# Patient Record
Sex: Female | Born: 1995 | Race: White | Hispanic: Yes | Marital: Married | Smoking: Never smoker
Health system: Southern US, Community
[De-identification: ages and names within clinical notes are randomized; demographics above are authoritative.]

## PROBLEM LIST (undated history)

## (undated) DIAGNOSIS — K130 Diseases of lips: Secondary | ICD-10-CM

## (undated) DIAGNOSIS — N979 Female infertility, unspecified: Secondary | ICD-10-CM

## (undated) DIAGNOSIS — D649 Anemia, unspecified: Secondary | ICD-10-CM

## (undated) DIAGNOSIS — IMO0002 Reserved for concepts with insufficient information to code with codable children: Secondary | ICD-10-CM

## (undated) DIAGNOSIS — M329 Systemic lupus erythematosus, unspecified: Secondary | ICD-10-CM

## (undated) DIAGNOSIS — F419 Anxiety disorder, unspecified: Secondary | ICD-10-CM

---
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
## 2016-03-14 ENCOUNTER — Encounter: Payer: Self-pay | Admitting: Pediatrics

## 2016-03-15 ENCOUNTER — Encounter: Payer: Self-pay | Admitting: Pediatrics

## 2017-01-31 ENCOUNTER — Ambulatory Visit (INDEPENDENT_AMBULATORY_CARE_PROVIDER_SITE_OTHER): Payer: Managed Care, Other (non HMO) | Admitting: Family Medicine

## 2017-01-31 VITALS — BP 117/79 | HR 86 | Temp 99.0°F | Resp 16 | Ht 63.0 in | Wt 139.0 lb

## 2017-01-31 DIAGNOSIS — R300 Dysuria: Secondary | ICD-10-CM | POA: Diagnosis not present

## 2017-01-31 LAB — POCT UA - MICROSCOPIC ONLY
MUCUS UA: ABSENT
WBC, Ur, HPF, POC: NEGATIVE

## 2017-01-31 LAB — POCT URINALYSIS DIPSTICK
BILIRUBIN UA: NEGATIVE
GLUCOSE UA: NEGATIVE
Ketones, UA: NEGATIVE
NITRITE UA: NEGATIVE
Spec Grav, UA: >=1.03 — AB (ref 1.010–1.025)
Urobilinogen, UA: 0.2 E.U./dL
pH, UA: 6 (ref 5.0–8.0)

## 2017-01-31 MED ORDER — CIPROFLOXACIN HCL 500 MG PO TABS: 500.0000 mg | ORAL_TABLET | Freq: Two times a day (BID) | ORAL | 0 refills | Status: AC

## 2017-01-31 NOTE — Progress Notes (Signed)
  Chief Complaint  Patient presents with  . Dysuria    Began monday    HPI   Pt reports that for the past 4 days  She took otc meds for UT that has not helped  She had burning with urination  She denies history of pyelo No blood in urine  No fevers  Reports chills +urinary frequency Pain at the end of the stream No vaginal discharge  Past Medical History:  Diagnosis Date  . 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 ciprofloxacin (CIPRO) 500 MG tablet Take 1 tablet (500 mg total) by mouth 2 (two) times daily. 6 tablet 0  . ipratropium (ATROVENT) 0.03 % nasal spray Use 2 sprays each nostril 3 or 4 times daily for cold (Patient not taking: Reported on 01/31/2017) 30 mL 0   No current facility-administered medications for this visit.     Allergies: No Known Allergies  Past Surgical History:  Procedure Laterality Date  . LABIOPLASTY Right 01/28/2013   Procedure: LABIAPLASTY;  Surgeon: Sherron MondayJody Bovard, MD;  Location: [facility];  Service: Gynecology;  Laterality: Right;  Right labia    Social History   Social History  . Marital status: Single    Spouse name: N/A  . Number of children: N/A  . Years of education: N/A   Social History Main Topics  . Smoking status: Never Smoker  . Smokeless tobacco: Not on file  . Alcohol use Not on file  . Drug use: Unknown  . Sexual activity: Not on file   Other Topics Concern  . Not on file   Social History Narrative  . No narrative on file    ROS  Objective: Vitals:   01/31/17 1447  BP: 117/79  Pulse: 86  Resp: 16  Temp: 99 F (37.2 C)  TempSrc: Oral  SpO2: 99%  Weight: 139 lb (63 kg)  Height: 5\' 3"  (1.6 m)    Physical Exam  Constitutional: She is oriented to person, place, and time. She appears well-developed and well-nourished.  HENT:  Head: Normocephalic and atraumatic.  Eyes: Conjunctivae and EOM are normal.  Cardiovascular: Normal rate, regular  rhythm and normal heart sounds.   No murmur heard. Pulmonary/Chest: Effort normal.  Abdominal: Soft. Bowel sounds are normal. She exhibits no distension. There is no tenderness.  Neurological: She is alert and oriented to person, place, and time.  Psychiatric: She has a normal mood and affect. Her behavior is normal. Judgment and thought content normal.    Assessment and Plan Alexis Taylor was seen today for dysuria.  Diagnoses and all orders for this visit:  Dysuria -     POCT urinalysis dipstick -     POCT UA - Microscopic Only -     Urine Culture  Will treat empirically with Cipro Discussed that since she took some OTC meds will send culture -     ciprofloxacin (CIPRO) 500 MG tablet; Take 1 tablet (500 mg total) by mouth 2 (two) times daily.     [name] A Geni Skorupski

## 2017-01-31 NOTE — Patient Instructions (Addendum)
Acute Urinary Retention, Female Urinary retention means you are unable to pee completely or at all (empty your bladder). Follow these instructions at home:  Drink enough fluids to keep your pee (urine) clear or pale yellow.  If you are sent home with a tube that drains the bladder (catheter), there will be a drainage bag attached to it. There are two types of bags. One is big that you can wear at night without having to empty it. One is smaller and needs to be emptied more often.  Keep the drainage bag emptied.  Keep the drainage bag lower than the tube.  Only take medicine as told by your doctor. Contact a doctor if:  You have a low-grade fever.  You have spasms or you are leaking pee when you have spasms. Get help right away if:  You have chills or a fever.  Your catheter stops draining pee.  Your catheter falls out.  You have increased bleeding that does not stop after you have rested and increased the amount of fluids you had been drinking. This information is not intended to replace advice given to you by your health care provider. Make sure you discuss any questions you have with your health care provider. Document Released: 01/23/2008 Document Revised: 01/12/2016 Document Reviewed: 01/15/2013 Elsevier Interactive Patient Education  2017 [employer]  

## 2017-02-05 LAB — URINE CULTURE

## 2017-10-05 ENCOUNTER — Encounter: Payer: Self-pay | Admitting: Family Medicine

## 2017-10-05 NOTE — Progress Notes (Unsigned)
No chief complaint on file.   Subjective:  Alexis Taylor is a 22 y.o. female here for a health maintenance visit.  Patient is {new /established:20252} pt  Patient Active Problem List   Diagnosis Date Noted  . 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 Pain in joint, ankle and foot 03/25/2013  . 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 LABIOPLASTY Right 01/28/2013   Procedure: LABIAPLASTY;  Surgeon: Sherron Monday, MD;  Location: [facility];  Service: Gynecology;  Laterality: Right;  Right labia     Outpatient Medications Prior to Visit  Medication Sig Dispense Refill  . ipratropium (ATROVENT) 0.03 % nasal spray Use 2 sprays each nostril 3 or 4 times daily for cold (Patient not taking: Reported on 01/31/2017) 30 mL 0   No facility-administered medications prior to visit.     No Known Allergies   No family history on file.   Health Habits: Dental Exam: up to date Eye Exam: up to date Exercise: *** times/week on average Current exercise activities: walking/running Diet:   Social History   Socioeconomic History  . Marital status: Single    Spouse name: Not on file  . Number of children: Not on file  . Years of education: Not on file  . Highest education level: Not on file  Social Needs  . Financial resource strain: Not on file  . Food insecurity - worry: Not on file  . Food insecurity - inability: Not on file  . Transportation needs - medical: Not on file  . Transportation needs - non-medical: Not on file  Occupational History  . Not on file  Tobacco Use  . Smoking status: Never Smoker  Substance and Sexual Activity  . Alcohol use: Not on file  . Drug use: Not on file  . Sexual activity: Not on file  Other Topics Concern  . Not on file  Social History Narrative  . Not on file   Social History   Substance and Sexual Activity   Alcohol Use Not on file   Social History   Tobacco Use  Smoking Status Never Smoker   Social History   Substance and Sexual Activity  Drug Use Not on file    GYN: Sexual Health Menstrual status: regular menses LMP: No LMP recorded. Last pap smear: see HM section History of abnormal pap smears:  Sexually active: ** with ** partner Current contraception:   Health Maintenance: See under health Maintenance activity for review of completion dates as well.  There is no immunization history on file for this patient.    Depression Screen-PHQ2/9 Depression screen [facility] 2/9 01/31/2017 05/07/2015  Decreased Interest 0 0  Down, Depressed, Hopeless 0 0  PHQ - 2 Score 0 0       Depression Severity and Treatment Recommendations:  0-4= None  5-9= Mild / Treatment: Support, educate to call if worse; return in one month  10-14= Moderate / Treatment: Support, watchful waiting; Antidepressant or Psycotherapy  15-19= Moderately severe / Treatment: Antidepressant OR Psychotherapy  >= 20 = Major depression, severe / Antidepressant AND Psychotherapy    Review of Systems   ROS  See HPI for ROS as well.    Objective:   There were no vitals filed for this visit.  There is no height or weight on file to calculate BMI.  Physical Exam     Assessment/Plan:  Patient was seen for a health maintenance exam.  Counseled the patient on health maintenance issues. Reviewed her health mainteance schedule and ordered appropriate tests (see orders.) Counseled on regular exercise and weight management. Recommend regular eye exams and dental cleaning.   The following issues were addressed today for health maintenance:   There are no diagnoses linked to this encounter.  No Follow-up on file.    There is no height or weight on file to calculate BMI.:  Discussed the patient's BMI with patient. The BMI body mass index is unknown because there is no height or weight on file.     Future  Appointments  Date Time Provider Department Center  10/05/2017 10:40 AM Doristine BosworthStallings, Darlisha Kelm A, MD PCP-PCP [facility]    There are no Patient Instructions on file for this visit.

## 2017-11-18 ENCOUNTER — Ambulatory Visit (INDEPENDENT_AMBULATORY_CARE_PROVIDER_SITE_OTHER): Payer: Self-pay | Admitting: Orthopaedic Surgery

## 2018-11-15 ENCOUNTER — Encounter (HOSPITAL_COMMUNITY): Payer: Self-pay | Admitting: Emergency Medicine

## 2018-11-15 ENCOUNTER — Emergency Department (HOSPITAL_COMMUNITY)
Admission: EM | Admit: 2018-11-15 | Discharge: 2018-11-15 | Disposition: A | Discharge status: Home / Self Care | Payer: Self-pay | Attending: Emergency Medicine | Admitting: Emergency Medicine

## 2018-11-15 ENCOUNTER — Other Ambulatory Visit: Payer: Self-pay

## 2018-11-15 DIAGNOSIS — N3090 Cystitis, unspecified without hematuria: Secondary | ICD-10-CM | POA: Insufficient documentation

## 2018-11-15 LAB — URINALYSIS, ROUTINE W REFLEX MICROSCOPIC
BILIRUBIN URINE: NEGATIVE
Glucose, UA: NEGATIVE mg/dL
HGB URINE DIPSTICK: NEGATIVE
KETONES UR: NEGATIVE mg/dL
LEUKOCYTE UA: NEGATIVE
Nitrite: POSITIVE — AB
Protein, ur: NEGATIVE mg/dL
SPECIFIC GRAVITY, URINE: 1.016 (ref 1.005–1.030)
pH: 6 (ref 5.0–8.0)

## 2018-11-15 LAB — POC URINE PREG, ED: PREG TEST UR: NEGATIVE

## 2018-11-15 MED ORDER — NITROFURANTOIN MONOHYD MACRO 100 MG PO CAPS: 100.0000 mg | ORAL_CAPSULE | Freq: Two times a day (BID) | ORAL | 0 refills | Status: DC

## 2018-11-15 MED ORDER — PHENAZOPYRIDINE HCL 200 MG PO TABS: 200.0000 mg | ORAL_TABLET | Freq: Three times a day (TID) | ORAL | 0 refills | Status: DC

## 2018-11-15 MED ORDER — NITROFURANTOIN MONOHYD MACRO 100 MG PO CAPS
100.0000 mg | ORAL_CAPSULE | Freq: Once | ORAL | Status: AC
  Administered 2018-11-15: 100 mg via ORAL
  Filled 2018-11-15: qty 1

## 2018-11-15 MED ORDER — PHENAZOPYRIDINE HCL 100 MG PO TABS
200.0000 mg | ORAL_TABLET | Freq: Once | ORAL | Status: AC
  Administered 2018-11-15: 200 mg via ORAL
  Filled 2018-11-15: qty 2

## 2018-11-15 NOTE — Discharge Instructions (Signed)
Please read and follow all provided instructions.  Your diagnoses today include:  1. Cystitis     Tests performed today include:  Urine test - suggests that you have an infection in your bladder  Vital signs. See below for your results today.   Medications prescribed:   Macrobid - antibiotic for urinary tract infection  You have been prescribed an antibiotic medicine: take the entire course of medicine even if you are feeling better. Stopping early can cause the antibiotic not to work.   Pyridium - medication for urinary tract infection symptoms.   This medication will turn your urine orange. This is normal.   Home care instructions:  Follow any educational materials contained in this packet.  Follow-up instructions: Please follow-up with your primary care provider in 3 days if symptoms are not resolved for further evaluation of your symptoms.  Return instructions:   Please return to the Emergency Department if you experience worsening symptoms.   Return with fever, worsening pain, persistent vomiting, worsening pain in your back.   Please return if you have any other emergent concerns.  Additional Information:  Your vital signs today were: Temp 98.2 F (36.8 C) (Oral)    Wt 63 kg    BMI 24.60 kg/m  If your blood pressure (BP) was elevated above 135/85 this visit, please have this repeated by your doctor within one month. --------------

## 2018-11-15 NOTE — ED Triage Notes (Signed)
Pt in from home with c/o urinary frequency, burning with urination, and bladder fullness x 5 days. Denies fevers or flank pain. States urine is malorodous

## 2018-11-15 NOTE — ED Provider Notes (Signed)
[facility] EMERGENCY DEPARTMENT Provider Note   CSN: [id number] Arrival date & time: 11/15/18  2094    History   Chief Complaint Chief Complaint  Patient presents with  . Urinary Symptoms    HPI Alexis Taylor is a 23 y.o. female.     Patient presents the emergency department with 5-day history of pubic pain, dysuria, increased frequency and urgency, and a small amount of hematuria.  No previous history of UTI.  Patient denies back pain, fevers, vomiting.  She has been nauseous.  She is on birth control and does not have regular periods.  No vaginal bleeding.  No treatments prior to arrival.  Pain is now constant over the bladder area.     Past Medical History:  Diagnosis Date  . 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 Pain in joint, ankle and foot 03/25/2013  . 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 LABIOPLASTY Right 01/28/2013   Procedure: LABIAPLASTY;  Surgeon: Sherron Monday, MD;  Location: [facility];  Service: Gynecology;  Laterality: Right;  Right labia     OB History   No obstetric history on file.      Home Medications    Prior to Admission medications   Medication Sig Start Date End Date Taking? Authorizing Provider  ipratropium (ATROVENT) 0.03 % nasal spray Use 2 sprays each nostril 3 or 4 times daily for cold Patient not taking: Reported on 01/31/2017 05/07/15   Peyton Najjar, MD    Family History No family history on file.  Social History Social History   Tobacco Use  . Smoking status: Never Smoker  Substance Use Topics  . Alcohol use: Not on file  . Drug use: Not on file     Allergies   Patient has no known allergies.   Review of Systems Review of Systems  Constitutional: Negative for fever.  HENT: Negative for rhinorrhea and sore throat.   Eyes: Negative for redness.   Respiratory: Negative for cough.   Cardiovascular: Negative for chest pain.  Gastrointestinal: Positive for nausea. Negative for abdominal pain, diarrhea and vomiting.  Genitourinary: Positive for dysuria, frequency, hematuria, pelvic pain and urgency.  Musculoskeletal: Negative for myalgias.  Skin: Negative for rash.  Neurological: Negative for headaches.     Physical Exam Updated Vital Signs There were no vitals taken for this visit.  Physical Exam Vitals signs and nursing note reviewed.  Constitutional:      Appearance: She is well-developed.  HENT:     Head: Normocephalic and atraumatic.  Eyes:     Conjunctiva/sclera: Conjunctivae normal.  Neck:     Musculoskeletal: Normal range of motion and neck supple.  Pulmonary:     Effort: No respiratory distress.  Abdominal:     Tenderness: There is abdominal tenderness in the suprapubic area. There is no guarding or rebound.  Skin:    General: Skin is warm and dry.  Neurological:     Mental Status: She is alert.      ED Treatments / Results  Labs (all labs ordered are listed, but only abnormal results are displayed) Labs Reviewed  URINALYSIS, ROUTINE W REFLEX MICROSCOPIC - Abnormal; Notable for the following components:      Result Value   Color, Urine AMBER (*)    Nitrite POSITIVE (*)    Bacteria, UA RARE (*)  All other components within normal limits  POC URINE PREG, ED    EKG None  Radiology No results found.  Procedures Procedures (including critical care time)  Medications Ordered in ED Medications  nitrofurantoin (macrocrystal-monohydrate) (MACROBID) capsule 100 mg (has no administration in time range)  phenazopyridine (PYRIDIUM) tablet 200 mg (200 mg Oral Given 11/15/18 0700)     Initial Impression / Assessment and Plan / ED Course  I have reviewed the triage vital signs and the nursing notes.  Pertinent labs & imaging results that were available during my care of the patient were reviewed by me  and considered in my medical decision making (see chart for details).        Patient seen and examined. Work-up initiated. Medications ordered. Clinically she has a UTI, but will need to confirm with UA and ensure no pregnancy.   Vital signs reviewed and are as follows: Temp 98.2 F (36.8 C) (Oral)   Wt 63 kg   BMI 24.60 kg/m   7:52 AM Neg preg. UA suggests UTI.  Will treat as uncomplicated cystitis.  Encouraged follow-up for recheck in 3 to 5 days if not improving.  Home with Macrobid and Pyridium.  Encouraged return or follow-up with fever, vomiting, back pain, new symptoms or other concerns.  Patient verbalizes understanding and agrees with plan.  Final Clinical Impressions(s) / ED Diagnoses   Final diagnoses:  Cystitis   Patient with signs and symptoms consistent with uncomplicated cystitis.  UA is suggestive of cystitis.  Doubt pyelonephritis at this time.  ED Discharge Orders         Ordered    nitrofurantoin, macrocrystal-monohydrate, (MACROBID) 100 MG capsule  2 times daily     11/15/18 0745    phenazopyridine (PYRIDIUM) 200 MG tablet  3 times daily     11/15/18 0746           Renne Crigler, PA-C 11/15/18 9622    Margarita Grizzle, MD 11/15/18 [phone]

## 2019-01-29 ENCOUNTER — Other Ambulatory Visit: Payer: Self-pay

## 2019-01-29 ENCOUNTER — Ambulatory Visit (HOSPITAL_COMMUNITY)
Admission: EM | Admit: 2019-01-29 | Discharge: 2019-01-29 | Discharge status: Absent without leave | Payer: Medicaid Other

## 2019-02-26 ENCOUNTER — Telehealth: Payer: Self-pay | Admitting: Family Medicine

## 2019-02-26 NOTE — Telephone Encounter (Signed)
[facility] is calling an appt with Dr. Pamella Pert for UTI & blackheads in both ears Please advise CB- [phone]

## 2019-03-03 ENCOUNTER — Other Ambulatory Visit: Payer: Self-pay

## 2019-03-03 ENCOUNTER — Ambulatory Visit (INDEPENDENT_AMBULATORY_CARE_PROVIDER_SITE_OTHER): Payer: Self-pay | Admitting: Family Medicine

## 2019-03-03 ENCOUNTER — Ambulatory Visit: Payer: Medicaid Other | Admitting: Family Medicine

## 2019-03-03 ENCOUNTER — Encounter: Payer: Self-pay | Admitting: Family Medicine

## 2019-03-03 VITALS — BP 108/76 | HR 86 | Temp 97.8°F | Ht 63.0 in | Wt 145.0 lb

## 2019-03-03 DIAGNOSIS — L700 Acne vulgaris: Secondary | ICD-10-CM

## 2019-03-03 DIAGNOSIS — R300 Dysuria: Secondary | ICD-10-CM

## 2019-03-03 LAB — POCT URINALYSIS DIP (MANUAL ENTRY)
Bilirubin, UA: NEGATIVE
Glucose, UA: NEGATIVE mg/dL
Ketones, POC UA: NEGATIVE mg/dL
Leukocytes, UA: NEGATIVE
Nitrite, UA: NEGATIVE
Protein Ur, POC: NEGATIVE mg/dL
Spec Grav, UA: 1.025 (ref 1.010–1.025)
Urobilinogen, UA: 1 E.U./dL
pH, UA: 7 (ref 5.0–8.0)

## 2019-03-03 LAB — POC MICROSCOPIC URINALYSIS (UMFC): Mucus: ABSENT

## 2019-03-03 MED ORDER — CLINDAMYCIN PHOS-BENZOYL PEROX 1-5 % EX GEL: Freq: Two times a day (BID) | CUTANEOUS | 0 refills | Status: DC

## 2019-03-03 NOTE — Progress Notes (Signed)
7/14/202011:07 AM  Alexis DanceEmily E Taylor 14-Oct-1995, 23 y.o., female [id number]  Chief Complaint  Patient presents with  . Pain    abdomin pain due to urinary issues, has taken 2 types of meds for the px, symptoms return wks later  . Ear Problem    has had breakout in the ears for yrs, was told they were blackheads, had one frozen off, they are starting to spread  to the other ear    HPI:   Patient is a 23 y.o. female who presents today for dysuria and ear pain  Dysuria with lower abd pain, intermittent, after taking antibiotics sx return in about 2 weeks No fever or chills, no flank pain, had hematuria with first incident, none since No vaginal discharge Symptoms started several months ago, prior to that no bladder issues She is having pelvic pain Dyspareunia, preceded dysuria Sexually active, used to be on depo, now using condoms consistently Seen in urgent care march 2020 - rx nitrofurantoin, no culture H/o right labioplasty, denies any issues  Has had ear lesions for about 3 years Saw derm x 2, told they were black heads, use OTC acne produts Has been using 10% benzyl peroxides wo improvement  No insurance   [facility] 03/03/2019 01/31/2017 05/07/2015  Decreased Interest 0 0 0  Down, Depressed, Hopeless 0 0 0  PHQ - 2 Score 0 0 0    Fall Risk  03/03/2019 01/31/2017  Falls in the past year? 0 No  Number falls in past yr: 0 -  Injury with Fall? 0 -     No Known Allergies  Prior to Admission medications   Medication Sig Start Date End Date Taking? Authorizing Provider  ipratropium (ATROVENT) 0.03 % nasal spray Use 2 sprays each nostril 3 or 4 times daily for cold Patient not taking: Reported on 01/31/2017 05/07/15   Peyton NajjarHopper, David H, MD    Past Medical History:  Diagnosis Date  . 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 LABIOPLASTY Right 01/28/2013   Procedure: LABIAPLASTY;  Surgeon: Sherron MondayJody Bovard, MD;   Location: [facility];  Service: Gynecology;  Laterality: Right;  Right labia    Social History   Tobacco Use  . Smoking status: Never Smoker  . Smokeless tobacco: Never Used  Substance Use Topics  . Alcohol use: Never    Alcohol/week: 0.0 standard drinks    Frequency: Never    No family history on file.  ROS   OBJECTIVE:  Today's Vitals   03/03/19 1057  BP: 108/76  Pulse: 86  Temp: 97.8 F (36.6 C)  TempSrc: Oral  SpO2: 100%  Weight: 145 lb (65.8 kg)  Height: 5\' 3"  (1.6 m)   Body mass index is 25.69 kg/m.   Physical Exam  Results for orders placed or performed in visit on 03/03/19 (from the past 24 hour(s))  POCT urinalysis dipstick     Status: Abnormal   Collection Time: 03/03/19 11:39 AM  Result Value Ref Range   Color, UA yellow yellow   Clarity, UA clear clear   Glucose, UA negative negative mg/dL   Bilirubin, UA negative negative   Ketones, POC UA negative negative mg/dL   Spec Grav, UA 4.0981.025 1.1911.010 - 1.025   Blood, UA trace-intact (A) negative   pH, UA 7.0 5.0 - 8.0   Protein Ur, POC negative negative mg/dL   Urobilinogen, UA 1.0 0.2 or 1.0 E.U./dL   Nitrite, UA Negative  Negative   Leukocytes, UA Negative Negative  POCT Microscopic Urinalysis ([facility])     Status: Abnormal   Collection Time: 03/03/19 12:01 PM  Result Value Ref Range   WBC,UR,HPF,POC None None WBC/hpf   RBC,UR,HPF,POC None None RBC/hpf   Bacteria Few (A) None, Too numerous to count   Mucus Absent Absent   Epithelial Cells, UR Per Microscopy Many (A) None, Too numerous to count cells/hpf     ASSESSMENT and PLAN  1. Dysuria Discussed no UTI. Advised [facility] to test for STDs and start contraception. Possible interstitial cystitis. Consider urology referral. - POCT urinalysis dipstick - POCT Microscopic Urinalysis ([facility])  2. Blackhead Evaluated by derm x 2. Discussed acne treatment. Consider adding retina a.  Other orders - clindamycin-benzoyl peroxide (BENZACLIN) gel; Apply topically  2 (two) times daily.  Return in about 3 months (around 06/03/2019) for acne.    Rutherford Guys, MD Primary Care at [place], [place] [address] Ph.  [phone] Fax [phone]

## 2019-03-03 NOTE — Patient Instructions (Signed)
Recommend STD testing and restarting contraception at [facility] of health  If STD testing is negative, then I would recommend urologist

## 2019-05-05 ENCOUNTER — Other Ambulatory Visit (HOSPITAL_COMMUNITY)
Admission: RE | Admit: 2019-05-05 | Discharge: 2019-05-05 | Disposition: A | Discharge status: Home / Self Care | Payer: Medicaid Other | Source: Ambulatory Visit | Attending: Family Medicine | Admitting: Family Medicine

## 2019-05-05 ENCOUNTER — Other Ambulatory Visit: Payer: Self-pay | Admitting: Family Medicine

## 2019-05-05 ENCOUNTER — Ambulatory Visit (INDEPENDENT_AMBULATORY_CARE_PROVIDER_SITE_OTHER): Payer: Self-pay | Admitting: Family Medicine

## 2019-05-05 ENCOUNTER — Encounter: Payer: Self-pay | Admitting: Family Medicine

## 2019-05-05 ENCOUNTER — Other Ambulatory Visit: Payer: Self-pay

## 2019-05-05 VITALS — BP 108/72 | HR 81 | Temp 98.5°F | Ht 63.0 in | Wt 147.0 lb

## 2019-05-05 DIAGNOSIS — N938 Other specified abnormal uterine and vaginal bleeding: Secondary | ICD-10-CM | POA: Diagnosis not present

## 2019-05-05 MED ORDER — CLINDAMYCIN PHOS-BENZOYL PEROX 1-5 % EX GEL: Freq: Two times a day (BID) | CUTANEOUS | 11 refills | Status: DC

## 2019-05-05 NOTE — Progress Notes (Signed)
9/15/202010:33 AM  Mellody DanceEmily E Taylor [birth year], 23 y.o., female [id number]  Chief Complaint  Patient presents with  . Menstrual Problem    has been bleeding since 8/21. Just got off depo last yr, this has happen before. Having heavy bleeding and clotting    HPI:   Patient is a 23 y.o. female who presents today for prolonged menstrual bleeding.  LMP 8/21, has not stopped yet, mostly heavy, cramping Had similar prolonged period in May 2020 Sexually active, trying to conceive for past year Used to have normal periods, heavy Last depo Sept 2019 Denies any fatigue, dizziness, SOB, palpitations, chest pain, vaginal discharge, nausea, vomiting G0 No h/o thyroid disorder, fibroids, been told that she might have endometriosis Does not smoke  [facility] 05/05/2019 03/03/2019 01/31/2017  Decreased Interest 0 0 0  Down, Depressed, Hopeless 0 0 0  PHQ - 2 Score 0 0 0    Fall Risk  05/05/2019 03/03/2019 01/31/2017  Falls in the past year? 0 0 No  Number falls in past yr: 0 0 -  Injury with Fall? 0 0 -     No Known Allergies  Prior to Admission medications   Not on File    Past Medical History:  Diagnosis Date  . 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 LABIOPLASTY Right 01/28/2013   Procedure: LABIAPLASTY;  Surgeon: Sherron MondayJody Bovard, MD;  Location: [facility];  Service: Gynecology;  Laterality: Right;  Right labia    Social History   Tobacco Use  . Smoking status: Never Smoker  . Smokeless tobacco: Never Used  Substance Use Topics  . Alcohol use: Never    Alcohol/week: 0.0 standard drinks    Frequency: Never    Family History  Problem Relation Age of Onset  . Healthy Mother   . Healthy Father     ROS Per hpi  OBJECTIVE:  Today's Vitals   05/05/19 1027  BP: 108/72  Pulse: 81  Temp: 98.5 F (36.9 C)  SpO2: 98%  Weight: 147 lb (66.7 kg)  Height: 5\' 3"  (1.6 m)   Body mass index is 26.04 kg/m.    Physical Exam Vitals signs and nursing note reviewed. Exam conducted with a chaperone present.  Constitutional:      Appearance: She is well-developed.  HENT:     Head: Normocephalic and atraumatic.  Eyes:     General: No scleral icterus.    Conjunctiva/sclera: Conjunctivae normal.     Pupils: Pupils are equal, round, and reactive to light.  Neck:     Musculoskeletal: Neck supple.  Pulmonary:     Effort: Pulmonary effort is normal.  Genitourinary:    General: Normal vulva.     Vagina: Normal.     Cervix: Cervical bleeding (scant brown blood from cervical os) present. No cervical motion tenderness, friability or lesion.     Uterus: Not enlarged, not fixed and not tender.      Adnexa:        Right: No tenderness or fullness.         Left: No tenderness or fullness.    Skin:    General: Skin is warm and dry.  Neurological:     Mental Status: She is alert and oriented to person, place, and time.     No results found for this or any previous visit (from the past 24 hour(s)).  No results found.   ASSESSMENT and PLAN  1. Dysfunctional uterine  Basic workup today. Advise seek obgyn to further discuss as having DUB and desires fertility. - Beta hCG quant (ref lab) - TSH - CBC - Cervicovaginal ancillary only  Other orders - clindamycin-benzoyl peroxide (BENZACLIN) gel; Apply topically 2 (two) times daily.  Return if symptoms worsen or fail to improve.

## 2019-05-05 NOTE — Patient Instructions (Addendum)
You can get your pap (cervical cancer screenings) at [facility] or [facility].   If you have lab work done today you will be contacted with your lab results within the next 2 weeks.  If you have not heard from [place] then please contact us. The fastest way to get your results is to register for My Chart.   IF you received an x-ray today, you will receive an invoice from [facility]. Please contact [facility] at [phone] with questions or concerns regarding your invoice.   IF you received labwork today, you will receive an invoice from [facility]. Please contact [facility] at [phone] with questions or concerns regarding your invoice.   Our billing staff will not be able to assist you with questions regarding bills from these companies.  You will be contacted with the lab results as soon as they are available. The fastest way to get your results is to activate your My Chart account. Instructions are located on the last page of this paperwork. If you have not heard from [place] regarding the results in 2 weeks, please contact this office.

## 2019-05-06 LAB — CERVICOVAGINAL ANCILLARY ONLY
Chlamydia: NEGATIVE
Neisseria Gonorrhea: NEGATIVE
Trichomonas: NEGATIVE

## 2019-05-06 LAB — CBC
Hematocrit: 40.5 % (ref 34.0–46.6)
Hemoglobin: 13.3 g/dL (ref 11.1–15.9)
MCH: 28.1 pg (ref 26.6–33.0)
MCHC: 32.8 g/dL (ref 31.5–35.7)
MCV: 86 fL (ref 79–97)
Platelets: 259 10*3/uL (ref 150–450)
RBC: 4.73 x10E6/uL (ref 3.77–5.28)
RDW: 12.4 % (ref 11.7–15.4)
WBC: 5.8 10*3/uL (ref 3.4–10.8)

## 2019-05-06 LAB — BETA HCG QUANT (REF LAB): hCG Quant: <1 m[IU]/mL

## 2019-05-06 LAB — TSH: TSH: 1.49 u[IU]/mL (ref 0.450–4.500)

## 2019-05-13 NOTE — Progress Notes (Signed)
Verbalized understanding

## 2019-06-04 ENCOUNTER — Ambulatory Visit: Payer: Medicaid Other | Admitting: Family Medicine

## 2019-06-10 ENCOUNTER — Other Ambulatory Visit: Payer: Self-pay | Admitting: *Deleted

## 2019-06-10 DIAGNOSIS — N6452 Nipple discharge: Secondary | ICD-10-CM

## 2019-06-10 DIAGNOSIS — N644 Mastodynia: Secondary | ICD-10-CM

## 2019-06-11 ENCOUNTER — Encounter (HOSPITAL_COMMUNITY): Payer: Self-pay

## 2019-06-11 ENCOUNTER — Ambulatory Visit (HOSPITAL_COMMUNITY)
Admission: RE | Admit: 2019-06-11 | Discharge: 2019-06-11 | Disposition: A | Discharge status: Home / Self Care | Payer: Medicaid Other | Source: Ambulatory Visit | Attending: Obstetrics and Gynecology | Admitting: Obstetrics and Gynecology

## 2019-06-11 ENCOUNTER — Other Ambulatory Visit: Payer: Self-pay

## 2019-06-11 DIAGNOSIS — N644 Mastodynia: Secondary | ICD-10-CM | POA: Insufficient documentation

## 2019-06-11 DIAGNOSIS — Z01419 Encounter for gynecological examination (general) (routine) without abnormal findings: Secondary | ICD-10-CM | POA: Insufficient documentation

## 2019-06-11 NOTE — Patient Instructions (Signed)
Explained breast self awareness with Doristine Church. Let patient know [facility] will cover Pap smears every 3 years unless has a history of abnormal Pap smears. Referred patient to [facility] for a bilateral breast ultrasound. Appointment scheduled for Thursday, June 11, 2019 at 1015. Patient aware of appointment and will be there. Let patient know will follow up with her within the next couple weeks with results of Pap smear by letter or phone. Doristine Church verbalized understanding.  Thomasene Dubow, Arvil Chaco, RN 9:22 AM

## 2019-06-11 NOTE — Progress Notes (Signed)
Complaints of bilateral diffuse breast pain that is greater within the right outer breast x 3 months. Patient states the pain comes and goes. Patient rates the pain at a 7 out of 10.  Pap Smear: Pap smear completed today. Per patient has never had a Pap smear completed. No Pap smear results are in Epic.  Physical exam: Breasts Right breast slightly larger than left breast that per patient is normal for her. No skin abnormalities bilateral breasts. No nipple retraction bilateral breasts. No nipple discharge bilateral breasts. No lymphadenopathy. No lumps palpated bilateral breasts. Complaints of bilateral outer breast pain on exam. Referred patient to [facility] for a bilateral breast ultrasound. Appointment scheduled for Thursday, June 11, 2019 at 1015.        Pelvic/Bimanual   Ext Genitalia No lesions, no swelling and no discharge observed on external genitalia.         Vagina Vagina pink and normal texture. No lesions or discharge observed in vagina.          Cervix Cervix is present. Cervix pink and of normal texture. No discharge observed.     Uterus Uterus is present and palpable. Uterus in normal position and normal size.        Adnexae Bilateral ovaries present and palpable. No tenderness on palpation.         Rectovaginal No rectal exam completed today since patient had no rectal complaints. No skin abnormalities observed on exam.    Smoking History: Patient has never smoked.  Patient Navigation: Patient education provided. Access to services provided for patient through [facility] program.   Breast and Cervical Cancer Risk Assessment: Patient has no family history of breast cancer, known genetic mutations, or radiation treatment to the chest before age 48. Patient has no history of cervical dysplasia, immunocompromised, or DES exposure in-utero. Breast cancer risk assessment completed. No breast cancer risk calculated due to patient is less than 74 years old.

## 2019-06-15 LAB — CYTOLOGY - PAP: Diagnosis: NEGATIVE

## 2019-06-16 ENCOUNTER — Other Ambulatory Visit: Payer: Medicaid Other

## 2019-06-19 ENCOUNTER — Telehealth (HOSPITAL_COMMUNITY): Payer: Self-pay | Admitting: *Deleted

## 2019-06-19 NOTE — Telephone Encounter (Signed)
Called patient and gave results to her Pap smear. Let patient know her Pap smear was normal and that her next Pap smear will be due in 3 years. Patient verbalized understanding.

## 2019-09-22 ENCOUNTER — Encounter (HOSPITAL_COMMUNITY): Payer: Self-pay

## 2020-06-10 ENCOUNTER — Encounter (HOSPITAL_COMMUNITY): Payer: Self-pay

## 2021-08-20 NOTE — L&D Delivery Note (Signed)
Delivery Note Pt noted to be 10cm dilated shortly after arriving on the floor. She requested an epidural and was able to sit for it. Once she was moderately comfortable we begun pushing. She pushed 3-4 time and at 4:46 AM a viable female was delivered via Vaginal, Spontaneous (Presentation: Right Occiput Anterior).  APGAR:9 ,9 ; weight pending. Anterior and posterior shoulders delivered with next two pushes; body easily followed. Infant was handed off on mothers' abdomen.  Cord was then clamped and cut after a minute delay. Cord blood obtained.    Placenta status: Spontaneous, Intact. Alexis Taylor.   Cord: 3 vessels with the following complications: None.  Cord pH: none  Anesthesia: Epidural Episiotomy: None Lacerations: Perineal and labial abrasions only. No repair needed  Suture Repair:  n/a Est. Blood Loss (mL):  351ml  Mom to postpartum.  Baby to Couplet care / Skin to Skin.  Isaiah Serge 05/16/2022, 5:11 AM

## 2021-11-20 ENCOUNTER — Encounter (HOSPITAL_COMMUNITY): Payer: Self-pay | Admitting: *Deleted

## 2021-11-20 ENCOUNTER — Inpatient Hospital Stay (HOSPITAL_COMMUNITY)
Admission: AD | Admit: 2021-11-20 | Discharge: 2021-11-20 | Disposition: A | Discharge status: Home / Self Care | Payer: 59 | Attending: Obstetrics and Gynecology | Admitting: Obstetrics and Gynecology

## 2021-11-20 ENCOUNTER — Inpatient Hospital Stay (HOSPITAL_COMMUNITY): Payer: 59

## 2021-11-20 DIAGNOSIS — J029 Acute pharyngitis, unspecified: Secondary | ICD-10-CM | POA: Diagnosis not present

## 2021-11-20 DIAGNOSIS — J019 Acute sinusitis, unspecified: Secondary | ICD-10-CM | POA: Diagnosis not present

## 2021-11-20 DIAGNOSIS — Z3A11 11 weeks gestation of pregnancy: Secondary | ICD-10-CM

## 2021-11-20 DIAGNOSIS — Z20822 Contact with and (suspected) exposure to covid-19: Secondary | ICD-10-CM | POA: Diagnosis not present

## 2021-11-20 DIAGNOSIS — R059 Cough, unspecified: Secondary | ICD-10-CM | POA: Insufficient documentation

## 2021-11-20 DIAGNOSIS — R0981 Nasal congestion: Secondary | ICD-10-CM | POA: Diagnosis not present

## 2021-11-20 DIAGNOSIS — O26891 Other specified pregnancy related conditions, first trimester: Secondary | ICD-10-CM | POA: Insufficient documentation

## 2021-11-20 DIAGNOSIS — Z2089 Contact with and (suspected) exposure to other communicable diseases: Secondary | ICD-10-CM | POA: Insufficient documentation

## 2021-11-20 DIAGNOSIS — R509 Fever, unspecified: Secondary | ICD-10-CM | POA: Diagnosis present

## 2021-11-20 HISTORY — DX: Anemia, unspecified: D64.9

## 2021-11-20 HISTORY — DX: Female infertility, unspecified: N97.9

## 2021-11-20 HISTORY — DX: Reserved for concepts with insufficient information to code with codable children: IMO0002

## 2021-11-20 HISTORY — DX: Anxiety disorder, unspecified: F41.9

## 2021-11-20 HISTORY — DX: Systemic lupus erythematosus, unspecified: M32.9

## 2021-11-20 LAB — RESP PANEL BY RT-PCR (FLU A&B, COVID) ARPGX2
Influenza A by PCR: NEGATIVE
Influenza B by PCR: NEGATIVE
SARS Coronavirus 2 by RT PCR: NEGATIVE

## 2021-11-20 LAB — GROUP A STREP BY PCR: Group A Strep by PCR: NOT DETECTED

## 2021-11-20 MED ORDER — AMOXICILLIN 250 MG/5ML PO SUSR: 500.0000 mg | Freq: Three times a day (TID) | ORAL | 0 refills | Status: AC

## 2021-11-20 MED ORDER — PSEUDOEPHEDRINE HCL 15 MG/5ML PO LIQD
60.0000 mg | Freq: Once | ORAL | Status: DC
  Filled 2021-11-20: qty 20

## 2021-11-20 MED ORDER — PSEUDOEPHEDRINE HCL 30 MG PO TABS: 60.0000 mg | ORAL_TABLET | Freq: Once | ORAL | Status: DC

## 2021-11-20 NOTE — MAU Provider Note (Signed)
?History  ?  ? ?CSN: [id number] ? ?Arrival date and time: 11/20/21 1152 ? ? Event Date/Time  ? First Provider Initiated Contact with Patient 11/20/21 1313   ?  ? ?Chief Complaint  ?Patient presents with  ? Fever  ? Cough  ? Chest Pain  ? ?Alexis Taylor is a 26 y.o. G1P0 at [redacted]w[redacted]d who receives care at [facility].  She presents today for Fever, Cough, and Chest Pressure.  She states her symptoms started Friday and she took a Covid test which was negative.  She states on Saturday night her temperature was 101, but improved with tylenol.  Patient states today she has productive cough with yellow mucous, chest pressure with coughing, sore throat, and body aches.  She also reports lower abdominal pressure with coughing that started last night.  Patient denies HA, visual disturbances, or N/V.  She endorses some lightheadedness and dizziness and ate a southwest chicken sandwich prior to arrival.  She denies history of allergies and has been vaccinated for Covid and Influenza.  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   ? ? ?OB History   ? ? Gravida  ?1  ? Para  ?0  ? Term  ?0  ? Preterm  ?0  ? AB  ?0  ? Living  ?0  ?  ? ? SAB  ?0  ? IAB  ?0  ? Ectopic  ?0  ? Multiple  ?0  ? Live Births  ?0  ?   ?  ?  ? ? ?Past Medical History:  ?Diagnosis Date  ? Anemia   ? Anxiety   ? 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 Infertility, female   ? 43yr to conceive  ? Lupus ([place])   ? "skin lupus"  ? ? ?Past Surgical History:  ?Procedure Laterality Date  ? LABIOPLASTY Right 01/28/2013  ? Procedure: LABIAPLASTY;  Surgeon: Thornell Sartorius, MD;  Location: [facility];  Service: Gynecology;  Laterality: Right;  Right labia  ? ? ?Family History  ?Problem Relation Age of Onset  ? Hypertension Mother   ? Diabetes Father   ? ? ?Social History  ? ?Tobacco Use  ? Smoking status: Never  ? Smokeless tobacco: Never  ?Vaping Use  ? Vaping Use: Never used  ?Substance Use Topics  ? Alcohol use: Never  ?   Alcohol/week: 0.0 standard drinks  ? Drug use: Never  ? ? ?Allergies: No Known Allergies ? ?Medications Prior to Admission  ?Medication Sig Dispense Refill Last Dose  ? acetaminophen (TYLENOL) 160 MG/5ML liquid Take by mouth every 4 (four) hours as needed for fever.    at 0900  ? Prenatal Vit-Fe Fumarate-FA (MULTIVITAMIN-PRENATAL) 27-0.8 MG TABS tablet Take 1 tablet by mouth daily at 12 noon.   Past Week  ? clindamycin-benzoyl peroxide (BENZACLIN) gel Apply topically 2 (two) times daily. 25 g 11   ? ? ?Review of Systems  ?Constitutional:  Positive for fever. Negative for chills.  ?HENT:  Positive for congestion, rhinorrhea and sore throat. Negative for ear pain.   ?Respiratory:  Positive for cough (Productive).   ?Cardiovascular:  Positive for chest pain (Pressure with coughing).  ?Gastrointestinal:  Positive for abdominal pain (Pressure with coughing). Negative for constipation, diarrhea, nausea and vomiting.  ?Genitourinary:  Negative for difficulty urinating, dysuria, vaginal bleeding and vaginal discharge.  ?Neurological:  Positive for dizziness, light-headedness and headaches.  ?Physical Exam  ? ?Blood pressure 122/70, pulse (!) 106, temperature 99 ?F (  37.2 ?C), temperature source Oral, resp. rate 18, height 5\' 2"  (1.575 m), weight 67.9 kg, last menstrual period 09/01/2021, SpO2 99 %. ? ?Physical Exam ?Constitutional:   ?   General: She is not in acute distress. ?   Appearance: Normal appearance. She is well-developed. She is not ill-appearing.  ?HENT:  ?   Head: Normocephalic and atraumatic.  ?Eyes:  ?   Conjunctiva/sclera: Conjunctivae normal.  ?Cardiovascular:  ?   Rate and Rhythm: Regular rhythm. Tachycardia present.  ?   Heart sounds: Normal heart sounds.  ?Pulmonary:  ?   Effort: Pulmonary effort is normal. No accessory muscle usage or respiratory distress.  ?   Breath sounds: Normal breath sounds.  ?Abdominal:  ?   General: Bowel sounds are normal.  ?   Palpations: Abdomen is soft.  ?   Tenderness: There  is no abdominal tenderness. There is no guarding.  ?Musculoskeletal:  ?   Cervical back: Normal range of motion.  ?Skin: ?   General: Skin is warm.  ?Neurological:  ?   Mental Status: She is alert and oriented to person, place, and time.  ?Psychiatric:     ?   Mood and Affect: Mood normal.  ? ? ?MAU Course  ?Procedures ?Results for orders placed or performed during the hospital encounter of 11/20/21 (from the past 24 hour(s))  ?Resp Panel by RT-PCR (Flu A&B, Covid) Nasopharyngeal Swab     Status: None  ? Collection Time: 11/20/21 12:52 PM  ? Specimen: Nasopharyngeal Swab; Nasopharyngeal(NP) swabs in vial transport medium  ?Result Value Ref Range  ? SARS Coronavirus 2 by RT PCR NEGATIVE NEGATIVE  ? Influenza A by PCR NEGATIVE NEGATIVE  ? Influenza B by PCR NEGATIVE NEGATIVE  ?Group A Strep by PCR     Status: None  ? Collection Time: 11/20/21 12:52 PM  ? Specimen: Throat; Sterile Swab  ?Result Value Ref Range  ? Group A Strep by PCR NOT DETECTED NOT DETECTED  ? ?DG Chest 1 View ? ?Result Date: 11/20/2021 ?CLINICAL DATA:  Chest pain, cough EXAM: CHEST  1 VIEW COMPARISON:  11/01/1998 FINDINGS: The heart size and mediastinal contours are within normal limits. Both lungs are clear. The visualized skeletal structures are unremarkable. Left hemidiaphragm is slightly elevated IMPRESSION: No active disease. Electronically Signed   By: Elmer Picker M.D.   On: 11/20/2021 14:03   ? ?MDM ?Exam ?Labs: Strep, Covid ?CXR ?Prescriptions ?Assessment and Plan  ?26 year old ?G1P0 at 11.3 weeks ?Productive Cough ?Sore Throat ?Nasal Congestion/Leakage ?Pneumonia Exposure ? ? ?-POC Reviewed ?-Exam performed. ?-Labs collected by nurse. ?-Discussed chest xray to r/o pneumonia ?-Discussed likely sinus infection in consideration of symptoms. ?-If all labs and Xrays negative will treat as such with antibiotics. ?-Plan to hydrate orally. ?-Will give liquid sudafed and await results. ?-Patient agreeable. ? ?Maryann Conners ?11/20/2021, 1:13 PM   ? ?Reassessment (2:19 PM) ?Sinuitis  ? ?-CXR returns negative ?-Per nurse, pharmacy reports no liquid Sudafed available. Order discontinued.  ?-Rx for Amoxicillin liquid sent to pharmacy on file.  ?-Patient to follow up as scheduled.  ?-Encouraged to call primary office or return to MAU if symptoms worsen or with the onset of new symptoms. ?-Discharged to home in stable condition. ? ?Maryann Conners MSN, CNM ?Advanced Practice Provider, [facility] ? ?

## 2021-11-20 NOTE — Discharge Instructions (Signed)

## 2021-11-20 NOTE — MAU Note (Signed)
Alexis Taylor is a 26 y.o.  here in MAU reporting: neg home covid test on Friday.  Had fever highest of 101 on Sat night.  Has a really bad  cough, yellowish mucous.   Pressure in her chest, worse at night. Now starting to have pain in lower abd from coughing.  Has a sore throat. Pt is pregnant ?LMP: 1/13, has been seen in office for preg.  ?Onset of complaint: Friday ?Pain score: 5-7 ?Vitals:  ? 11/20/21 1215  ?BP: 122/70  ?Pulse: (!) 106  ?Resp: 18  ?Temp: 99 ?F (37.2 ?C)  ?SpO2: 99%  ?   ?FH: 155 ?Lab orders placed from triage:   ?

## 2022-05-16 ENCOUNTER — Inpatient Hospital Stay (HOSPITAL_COMMUNITY): Payer: 59 | Admitting: Anesthesiology

## 2022-05-16 ENCOUNTER — Inpatient Hospital Stay (HOSPITAL_COMMUNITY)
Admission: AD | Admit: 2022-05-16 | Discharge: 2022-05-18 | DRG: 807 | Disposition: A | Discharge status: Home / Self Care | Payer: 59 | Attending: Obstetrics and Gynecology | Admitting: Obstetrics and Gynecology

## 2022-05-16 ENCOUNTER — Encounter (HOSPITAL_COMMUNITY): Payer: Self-pay

## 2022-05-16 DIAGNOSIS — O358XX0 Maternal care for other (suspected) fetal abnormality and damage, not applicable or unspecified: Secondary | ICD-10-CM | POA: Diagnosis present

## 2022-05-16 DIAGNOSIS — O26893 Other specified pregnancy related conditions, third trimester: Secondary | ICD-10-CM | POA: Diagnosis present

## 2022-05-16 DIAGNOSIS — Z3A38 38 weeks gestation of pregnancy: Secondary | ICD-10-CM | POA: Diagnosis not present

## 2022-05-16 DIAGNOSIS — Z8774 Personal history of (corrected) congenital malformations of heart and circulatory system: Secondary | ICD-10-CM

## 2022-05-16 LAB — CBC
HCT: 41.6 % (ref 36.0–46.0)
Hemoglobin: 13.7 g/dL (ref 12.0–15.0)
MCH: 27.1 pg (ref 26.0–34.0)
MCHC: 32.9 g/dL (ref 30.0–36.0)
MCV: 82.4 fL (ref 80.0–100.0)
Platelets: 268 10*3/uL (ref 150–400)
RBC: 5.05 MIL/uL (ref 3.87–5.11)
RDW: 14.5 % (ref 11.5–15.5)
WBC: 12.9 10*3/uL — ABNORMAL HIGH (ref 4.0–10.5)
nRBC: 0 % (ref 0.0–0.2)

## 2022-05-16 LAB — RPR: RPR Ser Ql: NONREACTIVE

## 2022-05-16 LAB — TYPE AND SCREEN
ABO/RH(D): O POS
Antibody Screen: NEGATIVE

## 2022-05-16 MED ORDER — IBUPROFEN 600 MG PO TABS
600.0000 mg | ORAL_TABLET | Freq: Four times a day (QID) | ORAL | Status: DC
  Administered 2022-05-16 – 2022-05-18 (×10): 600 mg via ORAL
  Filled 2022-05-16 (×10): qty 1

## 2022-05-16 MED ORDER — DIPHENHYDRAMINE HCL 25 MG PO CAPS: 25.0000 mg | ORAL_CAPSULE | Freq: Four times a day (QID) | ORAL | Status: DC | PRN

## 2022-05-16 MED ORDER — PHENYLEPHRINE 80 MCG/ML (10ML) SYRINGE FOR IV PUSH (FOR BLOOD PRESSURE SUPPORT): 80.0000 ug | PREFILLED_SYRINGE | INTRAVENOUS | Status: DC | PRN

## 2022-05-16 MED ORDER — SOD CITRATE-CITRIC ACID 500-334 MG/5ML PO SOLN: 30.0000 mL | ORAL | Status: DC | PRN

## 2022-05-16 MED ORDER — PRENATAL MULTIVITAMIN CH
1.0000 | ORAL_TABLET | Freq: Every day | ORAL | Status: DC
  Administered 2022-05-16 – 2022-05-18 (×3): 1 via ORAL
  Filled 2022-05-16 (×3): qty 1

## 2022-05-16 MED ORDER — COCONUT OIL OIL
1.0000 | TOPICAL_OIL | Status: DC | PRN
  Administered 2022-05-17: 1 via TOPICAL

## 2022-05-16 MED ORDER — SIMETHICONE 80 MG PO CHEW: 80.0000 mg | CHEWABLE_TABLET | ORAL | Status: DC | PRN

## 2022-05-16 MED ORDER — LIDOCAINE HCL (PF) 1 % IJ SOLN
INTRAMUSCULAR | Status: DC | PRN
  Administered 2022-05-16: 10 mL via EPIDURAL

## 2022-05-16 MED ORDER — TETANUS-DIPHTH-ACELL PERTUSSIS 5-2.5-18.5 LF-MCG/0.5 IM SUSY: 0.5000 mL | PREFILLED_SYRINGE | Freq: Once | INTRAMUSCULAR | Status: DC

## 2022-05-16 MED ORDER — LACTATED RINGERS IV SOLN: 500.0000 mL | Freq: Once | INTRAVENOUS | Status: DC

## 2022-05-16 MED ORDER — FENTANYL CITRATE (PF) 100 MCG/2ML IJ SOLN
100.0000 ug | Freq: Once | INTRAMUSCULAR | Status: AC
  Administered 2022-05-16: 100 ug via INTRAVENOUS
  Filled 2022-05-16: qty 2

## 2022-05-16 MED ORDER — EPHEDRINE 5 MG/ML INJ: 10.0000 mg | INTRAVENOUS | Status: DC | PRN

## 2022-05-16 MED ORDER — ACETAMINOPHEN 325 MG PO TABS: 650.0000 mg | ORAL_TABLET | ORAL | Status: DC | PRN

## 2022-05-16 MED ORDER — ONDANSETRON HCL 4 MG/2ML IJ SOLN: 4.0000 mg | Freq: Four times a day (QID) | INTRAMUSCULAR | Status: DC | PRN

## 2022-05-16 MED ORDER — SENNOSIDES-DOCUSATE SODIUM 8.6-50 MG PO TABS
2.0000 | ORAL_TABLET | Freq: Every day | ORAL | Status: DC
  Administered 2022-05-17 – 2022-05-18 (×2): 2 via ORAL
  Filled 2022-05-16 (×2): qty 2

## 2022-05-16 MED ORDER — OXYCODONE HCL 5 MG PO TABS: 5.0000 mg | ORAL_TABLET | ORAL | Status: DC | PRN

## 2022-05-16 MED ORDER — DIPHENHYDRAMINE HCL 50 MG/ML IJ SOLN: 12.5000 mg | INTRAMUSCULAR | Status: DC | PRN

## 2022-05-16 MED ORDER — OXYCODONE HCL 5 MG PO TABS: 10.0000 mg | ORAL_TABLET | ORAL | Status: DC | PRN

## 2022-05-16 MED ORDER — ONDANSETRON HCL 4 MG/2ML IJ SOLN: 4.0000 mg | INTRAMUSCULAR | Status: DC | PRN

## 2022-05-16 MED ORDER — LACTATED RINGERS IV SOLN: INTRAVENOUS | Status: DC

## 2022-05-16 MED ORDER — ONDANSETRON HCL 4 MG PO TABS: 4.0000 mg | ORAL_TABLET | ORAL | Status: DC | PRN

## 2022-05-16 MED ORDER — DIBUCAINE (PERIANAL) 1 % EX OINT
1.0000 | TOPICAL_OINTMENT | CUTANEOUS | Status: DC | PRN
  Administered 2022-05-16: 1 via RECTAL
  Filled 2022-05-16: qty 28

## 2022-05-16 MED ORDER — LACTATED RINGERS IV SOLN: 500.0000 mL | INTRAVENOUS | Status: DC | PRN

## 2022-05-16 MED ORDER — OXYTOCIN-SODIUM CHLORIDE 30-0.9 UT/500ML-% IV SOLN: 1.0000 m[IU]/min | INTRAVENOUS | Status: DC

## 2022-05-16 MED ORDER — OXYTOCIN-SODIUM CHLORIDE 30-0.9 UT/500ML-% IV SOLN: 2.5000 [IU]/h | INTRAVENOUS | Status: DC | PRN

## 2022-05-16 MED ORDER — WITCH HAZEL-GLYCERIN EX PADS
1.0000 | MEDICATED_PAD | CUTANEOUS | Status: DC | PRN
  Administered 2022-05-16: 1 via TOPICAL

## 2022-05-16 MED ORDER — ZOLPIDEM TARTRATE 5 MG PO TABS: 5.0000 mg | ORAL_TABLET | Freq: Every evening | ORAL | Status: DC | PRN

## 2022-05-16 MED ORDER — OXYCODONE-ACETAMINOPHEN 5-325 MG PO TABS: 1.0000 | ORAL_TABLET | ORAL | Status: DC | PRN

## 2022-05-16 MED ORDER — LIDOCAINE HCL (PF) 1 % IJ SOLN: 30.0000 mL | INTRAMUSCULAR | Status: DC | PRN

## 2022-05-16 MED ORDER — OXYTOCIN-SODIUM CHLORIDE 30-0.9 UT/500ML-% IV SOLN
2.5000 [IU]/h | INTRAVENOUS | Status: DC
  Administered 2022-05-16: 2.5 [IU]/h via INTRAVENOUS
  Filled 2022-05-16: qty 500

## 2022-05-16 MED ORDER — TERBUTALINE SULFATE 1 MG/ML IJ SOLN: 0.2500 mg | Freq: Once | INTRAMUSCULAR | Status: DC | PRN

## 2022-05-16 MED ORDER — BENZOCAINE-MENTHOL 20-0.5 % EX AERO
1.0000 | INHALATION_SPRAY | CUTANEOUS | Status: DC | PRN
  Administered 2022-05-16: 1 via TOPICAL
  Filled 2022-05-16: qty 56

## 2022-05-16 MED ORDER — OXYTOCIN BOLUS FROM INFUSION
333.0000 mL | Freq: Once | INTRAVENOUS | Status: AC
  Administered 2022-05-16: 333 mL via INTRAVENOUS

## 2022-05-16 MED ORDER — ACETAMINOPHEN 325 MG PO TABS
650.0000 mg | ORAL_TABLET | ORAL | Status: DC | PRN
  Administered 2022-05-16 – 2022-05-18 (×4): 650 mg via ORAL
  Filled 2022-05-16 (×4): qty 2

## 2022-05-16 MED ORDER — OXYCODONE-ACETAMINOPHEN 5-325 MG PO TABS: 2.0000 | ORAL_TABLET | ORAL | Status: DC | PRN

## 2022-05-16 MED ORDER — FENTANYL-BUPIVACAINE-NACL 0.5-0.125-0.9 MG/250ML-% EP SOLN
12.0000 mL/h | EPIDURAL | Status: DC | PRN
  Administered 2022-05-16: 12 mL/h via EPIDURAL
  Filled 2022-05-16: qty 250

## 2022-05-16 NOTE — Anesthesia Preprocedure Evaluation (Signed)

## 2022-05-16 NOTE — H&P (Signed)
Estefania Kamiya is a 26 y.o.prime female presenting at 35 1/[redacted]wks gestation with painful contractions. Noted to be 5cm dilated in MAU and SROM on arrival to floor.  She is dated per LMP which was confirmed with an 8 week [place]. Her pregnancy has been complicated by : Muscular ventricular septal defect - fetal echo noted small to moderate muscular VSD, pp echo recommended  Family history of Atrial septal defect requiring surgical repair - FOB and his nephew   NIPT expected range AFP neg. GBS neg  OB History     Gravida  1   Para      Term      Preterm      AB      Living         SAB      IAB      Ectopic      Multiple      Live Births             History reviewed. No pertinent past medical history. History reviewed. No pertinent surgical history. Family History: family history is not on file. Social History:  reports that she has never smoked. She has never used smokeless tobacco. She reports that she does not currently use alcohol. She reports that she does not currently use drugs.     Maternal Diabetes: No Genetic Screening: Normal Maternal Ultrasounds/Referrals: Normal Fetal Ultrasounds or other Referrals:  None Maternal Substance Abuse:  No Significant Maternal Medications:  None Significant Maternal Lab Results:  Group B Strep negative Number of Prenatal Visits:greater than 3 verified prenatal visits Other Comments:  None  Review of Systems  Constitutional:  Positive for activity change and fatigue.  Eyes:  Negative for photophobia and visual disturbance.  Respiratory:  Positive for shortness of breath. Negative for chest tightness.   Cardiovascular:  Positive for leg swelling. Negative for chest pain and palpitations.  Gastrointestinal:  Positive for abdominal pain.  Genitourinary:  Positive for pelvic pain.  Musculoskeletal:  Positive for back pain.  Neurological:  Negative for light-headedness and headaches.  Psychiatric/Behavioral:  The patient is  nervous/anxious.    Maternal Medical History:  Reason for admission: Contractions.   Contractions: Onset was 3-5 hours ago.   Frequency: regular.   Perceived severity is strong.   Fetal activity: Perceived fetal activity is normal.   Prenatal complications: no prenatal complications Prenatal Complications - Diabetes: none.   Dilation: Lip/rim Effacement (%): 100 Station: Plus 1 Exam by:: Conan Bowens, RN (difficulty checking due to patient position) Blood pressure 115/71, pulse 86, resp. rate 20, SpO2 98 %. Maternal Exam:  Uterine Assessment: Contraction strength is firm.  Contraction frequency is regular.  Abdomen: Patient reports generalized tenderness.  Estimated fetal weight is AGA.   Fetal presentation: vertex Introitus: Normal vulva. Vulva is negative for condylomata and lesion.  Normal vagina.  Vagina is negative for condylomata and discharge.  Amniotic fluid character: clear. Pelvis: adequate for delivery.   Cervix: Cervix evaluated by digital exam.     Fetal Exam Fetal Monitor Review: Baseline rate: 150.  Variability: moderate (6-25 bpm).   Pattern: accelerations present and no decelerations.   Fetal State Assessment: Category I - tracings are normal.   Physical Exam Constitutional:      Appearance: Normal appearance. She is normal weight.  Cardiovascular:     Rate and Rhythm: Normal rate.  Pulmonary:     Effort: Pulmonary effort is normal.  Abdominal:     Tenderness: There is generalized abdominal  tenderness.  Genitourinary:    General: Normal vulva.  Vulva is no lesion.     Vagina: No vaginal discharge.  Musculoskeletal:        General: Normal range of motion.     Cervical back: Normal range of motion.  Skin:    General: Skin is warm and dry.     Capillary Refill: Capillary refill takes 2 to 3 seconds.  Neurological:     General: No focal deficit present.     Mental Status: She is alert and oriented to person, place, and time. Mental status is  at baseline.  Psychiatric:        Mood and Affect: Mood normal.        Behavior: Behavior normal.        Thought Content: Thought content normal.        Judgment: Judgment normal.     Prenatal labs: ABO, Rh: --/--/PENDING (09/27 4680) Antibody: PENDING (09/27 0305) Rubella:   RPR:    HBsAg:    HIV:    GBS:     Assessment/Plan: 26yo prime at 74 1/[redacted]wks gestation in active labor - Admit  - GBS neg - Epidural prn  - Anticipate svd    Devota Viruet W Joette Schmoker 05/16/2022, 4:13 AM

## 2022-05-16 NOTE — Anesthesia Procedure Notes (Signed)
Epidural Patient location during procedure: OB Start time: 05/16/2022 4:01 AM End time: 05/16/2022 4:11 AM  Staffing Anesthesiologist: Lidia Collum, MD Performed: anesthesiologist   Preanesthetic Checklist Completed: patient identified, IV checked, risks and benefits discussed, monitors and equipment checked, pre-op evaluation and timeout performed  Epidural Patient position: sitting Prep: DuraPrep Patient monitoring: heart rate, continuous pulse ox and blood pressure Approach: midline Location: L3-L4 Injection technique: LOR air  Needle:  Needle type: Tuohy  Needle gauge: 17 G Needle length: 9 cm Needle insertion depth: 4 cm Catheter type: closed end flexible Catheter size: 19 Gauge Catheter at skin depth: 9 cm Test dose: negative  Assessment Events: blood not aspirated, injection not painful, no injection resistance, no paresthesia and negative IV test  Additional Notes Reason for block:procedure for pain

## 2022-05-16 NOTE — Lactation Note (Signed)
This note was copied from a baby's chart. Lactation Consultation Note  Patient Name: Girl Maeleigh Buschman [id number] Date: 05/16/2022 Reason for consult: L&D Initial assessment;Primapara;Early term 37-38.6wks Age:25 hours Assisted baby to the breast in football position. Breast heavy/full feeling. Hand express colostrum noted. Baby latched on strong. Flanged lips well. Compressed breast tissue to keep it from pulling on nipple. Mom is breast/formula feeding. Encouraged to BF before giving formula. Mom will be f/u on MBU today.  Maternal Data Has patient been taught Hand Expression?: Yes Does the patient have breastfeeding experience prior to this delivery?: No  Feeding    LATCH Score Latch: Grasps breast easily, tongue down, lips flanged, rhythmical sucking.  Audible Swallowing: None  Type of Nipple: Everted at rest and after stimulation (short shaft)  Comfort (Breast/Nipple): Filling, red/small blisters or bruises, mild/mod discomfort (breast heavy)  Hold (Positioning): Assistance needed to correctly position infant at breast and maintain latch.  LATCH Score: 6   Lactation Tools Discussed/Used    Interventions Interventions: Adjust position;Assisted with latch;Support pillows;Skin to skin;Position options;Hand express;Breast compression  Discharge    Consult Status Consult Status: Follow-up from L&D Date: 05/16/22 Follow-up type: In-patient    Theodoro Kalata 05/16/2022, 5:55 AM

## 2022-05-16 NOTE — Anesthesia Postprocedure Evaluation (Signed)
Anesthesia Post Note  Patient: Alexis Taylor  Procedure(s) Performed: AN AD HOC LABOR EPIDURAL     Anesthesia Post Evaluation No notable events documented.  Last Vitals:  Vitals:   05/16/22 0658 05/16/22 0755  BP: 113/73 106/70  Pulse: 88 (!) 103  Resp: 18 18  Temp: 37 C 36.6 C  SpO2: 98% 99%    Last Pain:  Vitals:   05/16/22 0755  TempSrc: Oral  PainSc:    Pain Goal:                   Sammie Bench

## 2022-05-16 NOTE — MAU Note (Signed)
.  Alexis Taylor is a 27 y.o. at [redacted]w[redacted]d here in MAU reporting: ctx started around 2000 every 5 minutes. Reports some bloody show denies LOF. +FM. GBS neg. Was 3cm this morning at prenatal visit in office.

## 2022-05-16 NOTE — Lactation Note (Signed)
This note was copied from a baby's chart. Lactation Consultation Note  Patient Name: Alexis Taylor Date: 05/16/2022 Reason for consult: Follow-up assessment;Primapara;1st time breastfeeding;Early term 37-38.6wks;Breastfeeding assistance;Mother's request (Robinson reviewed steps for latching, and worked with birth parent to obtained the depth at the breast, baby fed 8 mins with multiple swallows , increased with breast compressions, baby released on her own. LC encouraged mom to give the baby plenty BF time.) Age:67 hours LC reassured birth parent and grandmother baby is latching well , and mom has excellent flow of colostrum to hold off on the formula.  Maternal Data Has patient been taught Hand Expression?: Yes  Feeding Mother's Current Feeding Choice: Breast Milk and Formula Nipple Type: Slow - flow  LATCH Score Latch: Grasps breast easily, tongue down, lips flanged, rhythmical sucking.  Audible Swallowing: Spontaneous and intermittent  Type of Nipple: Everted at rest and after stimulation  Comfort (Breast/Nipple): Soft / non-tender  Hold (Positioning): Assistance needed to correctly position infant at breast and maintain latch.  LATCH Score: 9   Lactation Tools Discussed/Used    Interventions Interventions: Breast feeding basics reviewed;Assisted with latch;Skin to skin;Breast massage;Hand express;Breast compression;Adjust position;Support pillows;Position options;Education;LC Services brochure  Discharge Pump: DEBP;Personal  Consult Status Consult Status: Follow-up Date: 05/17/22 Follow-up type: In-patient    [place] 05/16/2022, 11:26 AM

## 2022-05-16 NOTE — Lactation Note (Signed)
This note was copied from a baby's chart. Lactation Consultation Note  Patient Name: Alexis Taylor [id number] Date: 05/16/2022 Reason for consult: Initial assessment;Primapara;1st time breastfeeding;Early term 37-38.6wks;Breastfeeding assistance (As LC entered the room famili member was trying to assist birth parent with latch. LC offered to assist checked diaper, change wet +  stool. Started to assist , and per birth parent needed to call for the nurse for assist. LC enc to call when finished.) Age:25 hours  Maternal Data    Feeding Mother's Current Feeding Choice: Breast Milk and Formula Nipple Type: Slow - flow  LATCH Score Latch:  (started to assist and mom had the need to go to the bathroom)                  Lactation Tools Discussed/Used    Interventions Interventions: Breast feeding basics reviewed;Education;LC Services brochure  Discharge Pump: Hands Free;Personal;DEBP  Consult Status Consult Status: Follow-up Date: 05/16/22 Follow-up type: In-patient    [place] 05/16/2022, 10:26 AM

## 2022-05-17 LAB — CBC
HCT: 28.6 % — ABNORMAL LOW (ref 36.0–46.0)
Hemoglobin: 9.4 g/dL — ABNORMAL LOW (ref 12.0–15.0)
MCH: 27.6 pg (ref 26.0–34.0)
MCHC: 32.9 g/dL (ref 30.0–36.0)
MCV: 84.1 fL (ref 80.0–100.0)
Platelets: 216 10*3/uL (ref 150–400)
RBC: 3.4 MIL/uL — ABNORMAL LOW (ref 3.87–5.11)
RDW: 14.6 % (ref 11.5–15.5)
WBC: 10.1 10*3/uL (ref 4.0–10.5)
nRBC: 0 % (ref 0.0–0.2)

## 2022-05-17 NOTE — Lactation Note (Addendum)
This note was copied from a baby's chart. Lactation Consultation Note  Patient Name: Alexis Taylor Date: 05/17/2022 Reason for consult: Follow-up assessment;1st time breastfeeding Age:24 hours  P1, Baby was sleepy first day but now is feeding more regularly.  5 voids and 4 stools in the last 24 hours. Mother was able to hand express good flow and gave baby on spoon prior to latching.  Demonstrated how to prepump with manual pump using 24 flange. Observed baby latch with intermittent swallows. Encouraged mother to breastfeed before offering formula.   Maternal Data Has patient been taught Hand Expression?: Yes Does the patient have breastfeeding experience prior to this delivery?: No  Feeding Mother's Current Feeding Choice: Breast Milk and Formula  LATCH Score Latch: Grasps breast easily, tongue down, lips flanged, rhythmical sucking.  Audible Swallowing: A few with stimulation  Type of Nipple: Everted at rest and after stimulation  Comfort (Breast/Nipple): Soft / non-tender  Hold (Positioning): Assistance needed to correctly position infant at breast and maintain latch.  LATCH Score: 8   Lactation Tools Discussed/Used  Manual pump   Interventions Interventions: Breast feeding basics reviewed;Assisted with latch;Hand express;Pre-pump if needed;Support pillows;Hand pump;Education  Discharge Pump: Personal;DEBP  Consult Status Consult Status: Follow-up Date: 05/18/22 Follow-up type: In-patient    [name] [facility] 05/17/2022, 8:44 AM

## 2022-05-17 NOTE — Progress Notes (Signed)
Patient is doing well.  She is ambulating, voiding, tolerating PO.  Pain control is good.  Lochia is appropriate  Vitals:   05/16/22 1202 05/16/22 1720 05/16/22 2108 05/17/22 0531  BP: (!) 95/59 113/76 113/72 110/69  Pulse: 98 92 92 89  Resp: 18 16 16 16   Temp: 98.1 F (36.7 C)  98.3 F (36.8 C) 98.3 F (36.8 C)  TempSrc: Axillary  Oral Oral  SpO2: 98% 100%  99%    NAD Fundus firm Ext: no edema  Lab Results  Component Value Date   WBC 10.1 05/17/2022   HGB 9.4 (L) 05/17/2022   HCT 28.6 (L) 05/17/2022   MCV 84.1 05/17/2022   PLT 216 05/17/2022    --/--/O POS (09/27 0305)  A/P 26 y.o. G1P1001 PPD#1. Routine care.   Continue to work on breastfeeding today Expect d/c tomorrow.    [place]

## 2022-05-17 NOTE — Social Work (Addendum)
CSW received consult for hx of Anxiety.  CSW met with MOB to offer support and complete assessment.    CSW met with MOB at bedside and introduced CSW role. CSW observed MOB breastfeeding the infant, FOB, and paternal grandmother at bedside. CSWMOB gave CSW permission to share all information with family present. CSW inquired how MOB has felt since giving birth. MOB reported she has been feeling good and shared the L&D went quickly. CSW inquired about the documented history of anxiety. MOB stated she does not have a history of anxiety. MOB denied any concerns with anxiety during the pregnancy. CSW discussed PPD/PPA. MOB reported being knowledgeable of symptoms. CSW provided education regarding the baby blues period vs. perinatal mood disorders, discussed treatment and gave resources for mental health follow up if concerns arise.  CSW recommended MOB complete a self-evaluation during the postpartum time period using the New Mom Checklist from Postpartum Progress and encouraged MOB to contact a medical professional if symptoms are noted at any time. CSW assessed MOB for safety. MOB denied thoughts of harm to self and others.   MOB reported she has essential items to care for the infant. CSW provided review of Sudden Infant Death Syndrome (SIDS) precautions. MOB has chosen [facility] for the infant's follow up care. CSW assessed MOB for additional needs. MOB stated that the insurance on file is no longer active. She inquired about emergency Medicaid and Medicaid insurance for the infant. MOB reported she is also interested in WIC. CSW provided resources for MOB to follow up with WIC. CSW reached out to [facility] financial counselor and notified them of MOB request. Per [place] [facility] they will follow up with MOB today. MOB has an appointment scheduled with [facility] on October 16th at 1pm.   CSW identifies no further need for intervention and no barriers to discharge at this time.   Josten Warmuth, MSW, LCSW [facility]  Clinical Social Worker  [phone] 05/17/2022  12:27 PM  

## 2022-05-18 MED ORDER — ACETAMINOPHEN 325 MG PO TABS: 650.0000 mg | ORAL_TABLET | Freq: Four times a day (QID) | ORAL | Status: AC | PRN

## 2022-05-18 MED ORDER — IBUPROFEN 200 MG PO TABS: 600.0000 mg | ORAL_TABLET | Freq: Four times a day (QID) | ORAL | Status: AC | PRN

## 2022-05-18 NOTE — Lactation Note (Signed)
This note was copied from a baby's chart. Lactation Consultation Note  Patient Name: Alexis Taylor Date: 05/18/2022 Reason for consult: Follow-up assessment Age:25 hours  P1, Baby is breastfeeding and formula feeding. Encouraged breast before formula. Feed on demand with cues.  Goal 8-12+ times per day after first 24 hrs.  Place baby STS if not cueing.  Answered questions about flange size for pumping.   Feeding Mother's Current Feeding Choice: Breast Milk and Formula  Interventions Interventions: Hand pump;Education  Discharge Discharge Education: Engorgement and breast care;Warning signs for feeding baby Pump: Personal (Aeroflow)  Consult Status Consult Status: Complete Date: 05/18/22    [name] [facility] 05/18/2022, 11:20 AM

## 2022-05-18 NOTE — Discharge Summary (Signed)
Postpartum Discharge Summary  Date of Service updated 05/18/22      Patient Name: Alexis Taylor DOB: 1995/08/27 MRN: [id number]  Date of admission: 05/16/2022 Delivery date:05/16/2022  Delivering provider: Carlynn Purl [facility]  Date of discharge: 05/18/2022  Admitting diagnosis: Labor and delivery, indication for care [O75.9] Intrauterine pregnancy: [redacted]w[redacted]d    Secondary diagnosis:  Principal Problem:   Labor and delivery, indication for care  Additional problems: none    Discharge diagnosis: Term Pregnancy Delivered                                              Post partum procedures: none Augmentation: N/A Complications: None  Hospital course: Onset of Labor With Vaginal Delivery      26y.o. yo G1P1001 at 25w1das admitted in Active Labor on 05/16/2022. Patient had an uncomplicated labor course as follows:  Membrane Rupture Time/Date: 3:59 AM ,05/16/2022   Delivery Method:Vaginal, Spontaneous  Episiotomy: None  Lacerations:  None;Perineal  Patient had an uncomplicated postpartum course.  She is ambulating, tolerating a regular diet, passing flatus, and urinating well. Patient is discharged home in stable condition on 05/18/22.  Newborn Data: Birth date:05/16/2022  Birth time:4:46 AM  Gender:Female  Living status:Living  Apgars:9 ,9  Weight:3232 g   Magnesium Sulfate received: No BMZ received: No Rhophylac:N/A MMR:N/A T-DaP:Given prenatally Flu: No Transfusion:No  Physical exam  Vitals:   05/16/22 2108 05/17/22 0531 05/17/22 2014 05/18/22 0502  BP: 113/72 110/69 114/71 100/62  Pulse: 92 89 92 78  Resp: '16 16 16 16  ' Temp: 98.3 F (36.8 C) 98.3 F (36.8 C) 98.7 F (37.1 C) 98.7 F (37.1 C)  TempSrc: Oral Oral Oral Oral  SpO2:  99%     General: alert, cooperative, and no distress Lochia: appropriate Uterine Fundus: firm Incision: N/A DVT Evaluation: No evidence of DVT seen on physical exam. Labs: Lab Results  Component Value Date   WBC 10.1 05/17/2022    HGB 9.4 (L) 05/17/2022   HCT 28.6 (L) 05/17/2022   MCV 84.1 05/17/2022   PLT 216 05/17/2022       No data to display         Edinburgh Score:    05/17/2022    8:00 AM  Edinburgh Postnatal Depression Scale Screening Tool  I have been able to laugh and see the funny side of things. 0  I have looked forward with enjoyment to things. 0  I have blamed myself unnecessarily when things went wrong. 1  I have been anxious or worried for no good reason. 0  I have felt scared or panicky for no good reason. 0  Things have been getting on top of me. 1  I have been so unhappy that I have had difficulty sleeping. 1  I have felt sad or miserable. 1  I have been so unhappy that I have been crying. 0  The thought of harming myself has occurred to me. 0  Edinburgh Postnatal Depression Scale Total 4      After visit meds:  Allergies as of 05/18/2022   No Known Allergies      Medication List     TAKE these medications    acetaminophen 325 MG tablet Commonly known as: Tylenol Take 2 tablets (650 mg total) by mouth every 6 (six) hours as needed (for pain scale < 4).  ibuprofen 200 MG tablet Commonly known as: ADVIL Take 3 tablets (600 mg total) by mouth every 6 (six) hours as needed.   prenatal vitamin w/FE, FA 27-1 MG Tabs tablet Take 1 tablet by mouth daily at 12 noon.         Discharge home in stable condition Infant Feeding: Breast Infant Disposition:home with mother Discharge instruction: per After Visit Summary and Postpartum booklet. Activity: Advance as tolerated. Pelvic rest for 6 weeks.  Diet: routine diet Anticipated Birth Control: Unsure Postpartum Appointment:4 weeks Additional Postpartum F/U:  none Future Appointments:No future appointments. Follow up Visit:  Follow-up Information     [facility], [facility]. Schedule an appointment as soon as possible for a visit in 4 week(s).   Contact information: [address] [place]  [address] [phone]                     05/18/2022 Rowland Lathe, MD

## 2022-05-18 NOTE — Progress Notes (Signed)
Post Partum Day 2 Subjective: Alexis Taylor is doing well this morning. Ambulating, voiding, tolerating PO. Pain controlled. Minimal lochia.  Baby going for echo this AM  Objective: Patient Vitals for the past 24 hrs:  BP Temp Temp src Pulse Resp  05/18/22 0502 100/62 98.7 F (37.1 C) Oral 78 16  05/17/22 2014 114/71 98.7 F (37.1 C) Oral 92 16    Physical Exam:  General: alert, cooperative, and no distress Lochia: appropriate Uterine Fundus: firm DVT Evaluation: No evidence of DVT seen on physical exam.  Recent Labs    05/16/22 0305 05/17/22 0421  WBC 12.9* 10.1  HGB 13.7 9.4*  HCT 41.6 28.6*  PLT 268 216    No results for input(s): "NA", "K", "CL", "CO2CT", "BUN", "CREATININE", "GLUCOSE", "BILITOT", "ALT", "AST", "ALKPHOS", "PROT", "ALBUMIN" in the last 72 hours.  No results for input(s): "CALCIUM", "MG", "PHOS" in the last 72 hours.  No results for input(s): "PROTIME", "APTT", "INR" in the last 72 hours.  No results for input(s): "PROTIME", "APTT", "INR", "FIBRINOGEN" in the last 72 hours. Assessment/Plan:  Alexis Taylor 26 y.o. G1P1001 PPD#2 sp SVD 1. PPC: routine PP care 2.  Rh pos 3. Dispo: discharge home today, instructions reviewed.    LOS: 2 days   Rowland Lathe 05/18/2022, 9:19 AM

## 2022-05-21 ENCOUNTER — Encounter (HOSPITAL_COMMUNITY): Payer: Self-pay | Admitting: *Deleted

## 2022-05-26 ENCOUNTER — Telehealth (HOSPITAL_COMMUNITY): Payer: Self-pay | Admitting: *Deleted

## 2022-05-26 NOTE — Telephone Encounter (Signed)
Patient voiced no questions or concerns regarding her health at this time. EPDS=5. Patient voiced no questions or concerns regarding infant at this time. Patient reports infant sleeps in a bassinet on her back. RN reviewed ABCs of safe sleep. Patient verbalized understanding. Patient requested RN email information on hospital's virtual postpartum classes and support groups. Email sent. Erline Levine, RN, 05/26/22, [phone]

## 2022-06-15 ENCOUNTER — Encounter: Payer: Self-pay | Admitting: Neurology

## 2022-08-09 NOTE — Progress Notes (Deleted)
Initial neurology clinic note  Alexis Taylor MRN: [id number] DOB: July 18, 1996  Referring provider: Waynard Reeds, MD  Primary care provider: Pcp, No  Reason for consult:  numbness in hands and feet  Subjective:  This is [name], a 26 y.o. ***-handed female with a medical history of *** who presents to neurology clinic with ***. The patient is accompanied by ***.  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 Headaches? Symptoms?***  MEDICATIONS:  Outpatient Encounter Medications as of 08/22/2022  Medication Sig   acetaminophen (TYLENOL) 160 MG/5ML liquid Take by mouth every 4 (four) hours as needed for fever.   acetaminophen (TYLENOL) 325 MG tablet Take 2 tablets (650 mg total) by mouth every 6 (six) hours as needed (for pain scale < 4).   ibuprofen (ADVIL) 200 MG tablet Take 3 tablets (600 mg total) by mouth every 6 (six) hours as needed.   Prenatal Vit-Fe Fumarate-FA (MULTIVITAMIN-PRENATAL) 27-0.8 MG TABS tablet Take 1 tablet by mouth daily at 12 noon.   prenatal vitamin w/FE, FA (PRENATAL 1 + 1) 27-1 MG TABS tablet Take 1 tablet by mouth daily at 12 noon.   No facility-administered encounter medications on file as of 08/22/2022.    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   Infertility, female    38yr to conceive   Lupus (HCC)    "skin lupus"    PAST SURGICAL HISTORY: Past Surgical History:  Procedure Laterality Date   LABIOPLASTY Right 01/28/2013   Procedure: LABIAPLASTY;  Surgeon: Sherron Monday, MD;  Location: [facility];  Service: Gynecology;  Laterality: Right;  Right labia    ALLERGIES: No Known Allergies  FAMILY HISTORY: Family History  Problem Relation Age of Onset   Hypertension Mother    Diabetes Father     SOCIAL HISTORY: Social History   Tobacco Use    Smoking status: Never   Smokeless tobacco: Never  Vaping Use   Vaping Use: Never used  Substance Use Topics   Alcohol use: Not Currently   Drug use: Not Currently   Social History   Social History Narrative   ** Merged History Encounter **        Objective:  Vital Signs:  LMP 09/01/2021   ***  Labs and Imaging review: Internal labs: No results found for: "HGBA1C" No results found for: "VITAMINB12" Lab Results  Component Value Date   TSH 1.490 05/05/2019   No results found for: "ESRSEDRATE", "POCTSEDRATE"  ***  External labs: Vit D (11/25/20): 12 B12 (11/25/20): 162 TSH (09/08/19): 1.654 ***  Imaging: MRI brain w/wo contrast (10/31/2005): Findings: There is an arachnoid cyst in the right frontal region. This measures 9 x 34 mm. It follows CSF on all sequences and does not enhance. There is some mild mass effect on the right frontal lobe which is pushed posteriorly. It is possible that this is the cause of headaches or this could be an incidental finding.   The ventricles are normal enlarged. There is no midline shift. No infarct is identified.  The patient does have significant sinus disease with diffuse mucosal thickening throughout the maxillary and ethmoid sinuses. There is milder thickening in the sphenoid and frontal sinuses. The enhancing pattern of the brain is normal.  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  Sinusitis.   Assessment/Plan:  Alexis Taylor is a 26 y.o. female who presents for evaluation of ***. *** has a relevant medical history of ***. *** neurological examination is pertinent for ***. Available diagnostic data is significant for ***. This constellation of symptoms and objective data would most likely localize to ***. ***  PLAN: -Blood work: *** ***Vit D 1000 IU daily ***B12 1000 mcg daily ***Repeat MRI?  -Return to clinic ***  The impression above as well as the plan as outlined below were extensively  discussed with the patient (in the company of ***) who voiced understanding. All questions were answered to their satisfaction.  The patient was counseled on pertinent fall precautions per the printed material provided today, and as noted under the "Patient Instructions" section below.***  When available, results of the above investigations and possible further recommendations will be communicated to the patient via telephone/MyChart. Patient to call office if not contacted after expected testing turnaround time.   Total time spent reviewing records, interview, history/exam, documentation, and coordination of care on day of encounter:  *** min   Thank you for allowing me to participate in patient's care.  If I can answer any additional questions, I would be pleased to do so.  Alexis Balint, MD   CC: Pcp, No No address on file  CC: Referring provider: Waynard Reeds, MD [address] [place],  [place] [address]

## 2022-08-22 ENCOUNTER — Encounter: Payer: Self-pay | Admitting: Neurology

## 2022-08-22 ENCOUNTER — Ambulatory Visit: Payer: 59 | Admitting: Neurology

## 2022-12-02 IMAGING — DX DG CHEST 1V
1 series · 1 of 1 positions shown · non-contrast
Comparison: 11/01/1998

CLINICAL DATA: Chest pain, cough

EXAM:
CHEST  1 VIEW

[chest]
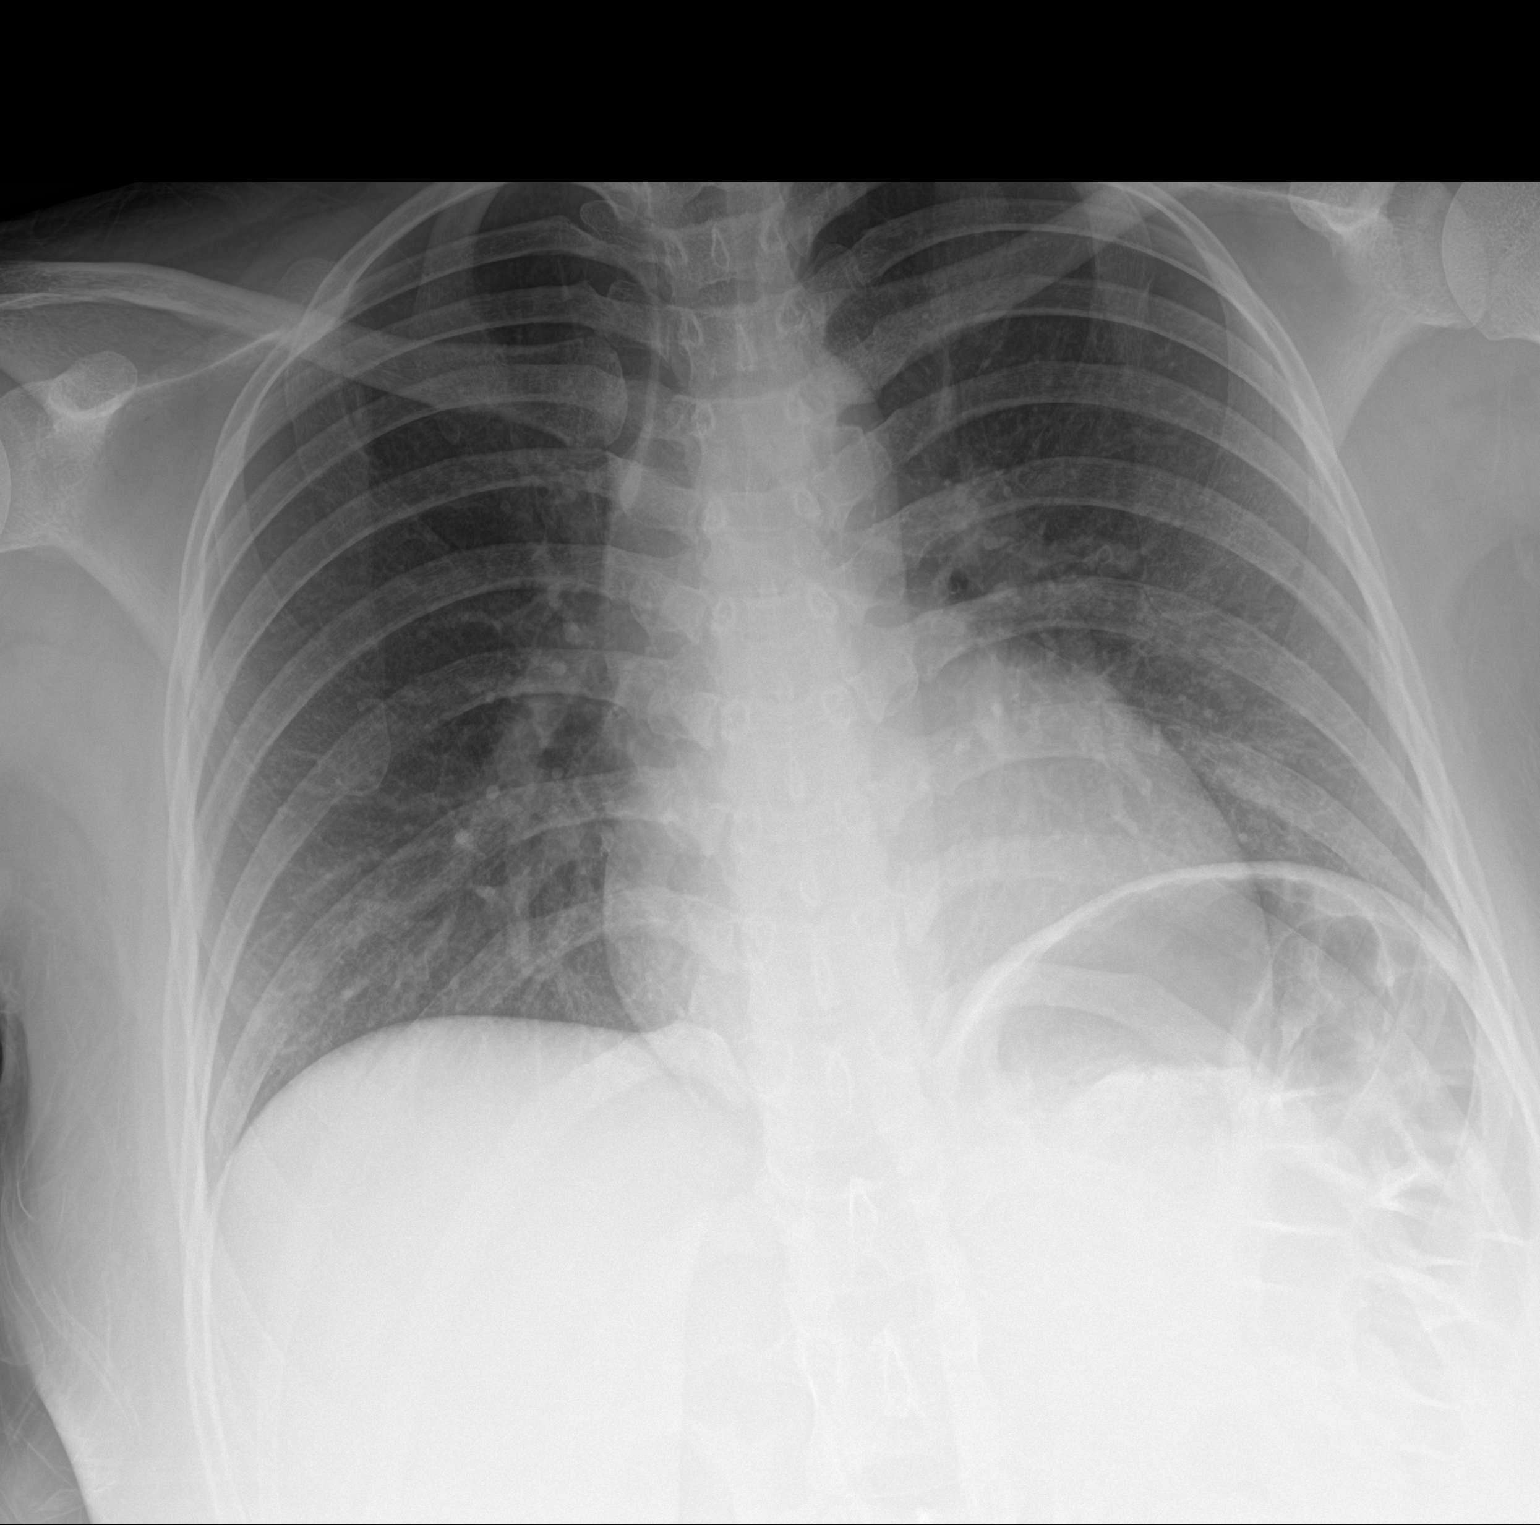

[1 of 1 positions shown; findings below may reference images not displayed]

FINDINGS: The heart size and mediastinal contours are within normal limits.
Both lungs are clear. The visualized skeletal structures are
unremarkable. Left hemidiaphragm is slightly elevated
IMPRESSION: No active disease.

## 2023-02-18 ENCOUNTER — Ambulatory Visit: Payer: 59 | Admitting: Emergency Medicine

## 2023-06-14 ENCOUNTER — Encounter: Payer: Self-pay | Admitting: Internal Medicine

## 2023-06-14 ENCOUNTER — Ambulatory Visit: Payer: Medicaid Other | Attending: Internal Medicine | Admitting: Internal Medicine

## 2023-06-14 VITALS — BP 110/72 | HR 85 | Resp 12 | Ht 62.5 in | Wt 147.0 lb

## 2023-06-14 DIAGNOSIS — L930 Discoid lupus erythematosus: Secondary | ICD-10-CM | POA: Diagnosis not present

## 2023-06-14 DIAGNOSIS — E559 Vitamin D deficiency, unspecified: Secondary | ICD-10-CM

## 2023-06-14 DIAGNOSIS — D509 Iron deficiency anemia, unspecified: Secondary | ICD-10-CM

## 2023-06-14 DIAGNOSIS — M2550 Pain in unspecified joint: Secondary | ICD-10-CM | POA: Diagnosis not present

## 2023-06-14 MED ORDER — CLOBETASOL PROPIONATE 0.05 % EX SOLN: CUTANEOUS | 1 refills | Status: DC

## 2023-06-14 NOTE — Progress Notes (Signed)
Office Visit Note  Patient: Alexis Taylor             Date of Birth: 01-06-1996           MRN: [id number]             PCP: Pcp, No Referring: Dani Gobble, Demetrius Charity* Visit Date: 06/14/2023  Subjective:  New Patient (Initial Visit) (Patient states she now has joint in her knees, ankles, and wrists. Patient states she gets rashes on her face and she has lesions on her scalp. )   History of Present Illness: Alexis Taylor is a 27 y.o. 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 She was previously diagnosed since 2021 with onset of symptoms with lesions on the right ear with a biopsy performed in December 2020 findings consistent with cutaneous lupus erythematosus.  She had evaluation in rheumatology clinic in 2021 with lab testing indicating negative ANA no other concerning serology.  She was treated with topical tacrolimus and clobetasol as needed for skin lesions.  And last saw Dr. Erroll Luna 2022 without any maintenance therapy.  Continued having inflammation involving both ears with occasional outbreaks on the scalp.  Did not have any particular scarring lesions reported on the scalp.  During pregnancy with her now 85-year-old child she felt symptoms were doing pretty well off of any maintenance therapy.  Subsequently feels a bit worse overall with the skin rashes also describes joint pain and stiffness in multiple areas intermittently.. She saw Dr. Reche Dixon in April with chronic rash involvement on scalp and concha of ears and started on hydroxychloroquine 300 mg daily as well as topical corticosteroids. Joint problems have been worse for about 2 years.  She has a history of bilateral ankle injuries associated with playing tennis years ago.  Initially leg pain and swelling was attributed to pregnancy.  During the past few months she gets some pain and stiffness most commonly affecting bilateral knees.  Occasionally sees visible swelling often has some pain without visual changes.   Usually worst in the front of the knee.  She does not take over-the-counter medications often sometimes uses ibuprofen with a partial benefit. She gets mouth ulcers intermittently most often along the inside lower lip sometimes on the roof of the mouth and these are usually not painful.  She does get erythematous rash on the face this is usually worse after sun exposure.  Does not get visible rash on other exposed areas though. She does have dry eye symptoms often also had Lasix eye surgery which she thinks might be contributory. She has not noticed any particular hair loss, lymphadenopathy, or Raynaud's symptoms. She is interested in having additional children possibly in the next 2 or 3 years but not currently attempting to conceive. She is interested in transferring care to our practice as apparently her sister is a patient here with for what sounds like pulmonary hypertension secondary to scleroderma.  Labs reviewed 11/2020 dsDNA neg C3 138 C4 71 Vit D 12  08/2019 ANA neg  Activities of Daily Living:  Patient reports morning stiffness for 5-10 minutes.   Patient Denies nocturnal pain.  Difficulty dressing/grooming: Denies Difficulty climbing stairs: Denies Difficulty getting out of chair: Denies Difficulty using hands for taps, buttons, cutlery, and/or writing: Denies  Review of Systems  Constitutional:  Positive for fatigue.  HENT:  Positive for mouth sores. Negative for mouth dryness.   Eyes:  Positive for dryness.  Respiratory:  Positive for shortness of breath.  Cardiovascular:  Negative for chest pain and palpitations.  Gastrointestinal:  Positive for blood in stool and constipation. Negative for diarrhea.  Endocrine: Negative for increased urination.  Genitourinary:  Negative for involuntary urination.  Musculoskeletal:  Positive for joint pain, joint pain and morning stiffness. Negative for gait problem, joint swelling, myalgias, muscle weakness, muscle tenderness and  myalgias.  Skin:  Positive for rash and sensitivity to sunlight. Negative for color change and hair loss.  Allergic/Immunologic: Positive for susceptible to infections.  Neurological:  Positive for dizziness and headaches.  Hematological:  Negative for swollen glands.  Psychiatric/Behavioral:  Negative for depressed mood and sleep disturbance. The patient is nervous/anxious.     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   Acne 01/02/2012   Dysmenorrhea 12/05/2011   Left ankle injury 12/05/2011   Iron deficiency anemia 08/01/2011   Vitamin D deficiency 08/01/2011   Anemia 07/27/2011   Fatigue 07/27/2011   Health check for child over 65 days old 07/27/2011    Past Medical History:  Diagnosis Date   Anemia    Anxiety    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   Objective: Vital Signs: BP 110/72 (BP Location: Right Arm, Patient Position: Sitting, Cuff Size: Normal)   Pulse 85   Resp 12   Ht 5' 2.5" (1.588 m)   Wt 147 lb (66.7 kg)   LMP 06/14/2023   Breastfeeding No   BMI 26.46 kg/m     Physical Exam HENT:     Mouth/Throat:     Mouth: Mucous membranes are moist.     Pharynx: Oropharynx is clear.  Eyes:     Conjunctiva/sclera: Conjunctivae normal.  Cardiovascular:     Rate and Rhythm: Normal rate and regular rhythm.  Pulmonary:     Effort: Pulmonary effort is normal.     Breath sounds: Normal breath sounds.  Lymphadenopathy:     Cervical: No cervical adenopathy.  Skin:    General: Skin is warm and dry.     Findings: Rash present.     Comments: Scarring lesions on both ears at concha No visible rashes on scalp, no alopecia Faint erythema on central areas of face no maculopapules or visible telangiectasias  Neurological:     Mental Status: She is alert.  Psychiatric:        Mood and Affect: Mood normal.      Musculoskeletal Exam:  Shoulders full ROM no tenderness or swelling Elbows full ROM no tenderness or swelling Wrists full ROM no tenderness or  swelling Fingers full ROM no tenderness or swelling Knees full ROM no tenderness or swelling Ankles full ROM no tenderness or swelling   Investigation: No additional findings.  Imaging: No results found.  Recent Labs: Lab Results  Component Value Date   WBC 10.1 05/17/2022   HGB 9.4 (L) 05/17/2022   PLT 216 05/17/2022    Speciality Comments: No specialty comments available.  Procedures:  No procedures performed Allergies: Patient has no known allergies.   Assessment / Plan:     Visit Diagnoses: Discoid lupus erythematosus - Plan: Sedimentation rate, C-reactive protein, Anti-DNA antibody, double-stranded, C3 and C4, Protein / creatinine ratio, urine, ANA, clobetasol (TEMOVATE) 0.05 % external solution  Definite history of chronic continuous lupus is not clear whether she really has systemic disease previous negative ANA and exam is overall reassuring.  Checking sed rate CRP double-stranded ENA complements urine protein creatinine ratio and rechecking ANA for any evidence of progression to  systemic disease.  If baseline labs are okay will recommend trying to reduce hydroxychloroquine to just 200 mg daily and see if this improves the easy bruising side effect.  Will need to get in with ophthalmology for annual retinal toxicity screening.  Sent new prescription for clobetasol scalp solution for use as needed on affected areas.  Polyarthralgia  Discussed joint pain and stiffness may have a degree of patellofemoral pain syndrome unrelated to inflammatory arthritis.  Agreed with as needed use of oral NSAIDs as a first-line treatment option.  We can also try to follow-up if or when she experiences symptom exacerbation as describes previous episodes of knee effusion.  Vitamin D deficiency - Plan: VITAMIN D 25 Hydroxy (Vit-D Deficiency, Fractures)  Previous vitamin D deficiency but has not been checked for greater than 1 year.  Discussed importance for UV skin protection in the setting of cutaneous lupus.  Consequently also benefit from maintenance of vitamin D supplementation.  Iron deficiency anemia, unspecified iron deficiency anemia type - Plan: Iron, TIBC and Ferritin Panel  Previous anemia still present on most recent lab with hemoglobin 9.9.  Documented as history of IDA but with normal corpuscular volume rule out anemia of chronic disease.  Will recheck iron panel.  Orders: Orders Placed This Encounter  Procedures   Sedimentation rate   C-reactive protein   Anti-DNA antibody, double-stranded   C3 and C4   Protein / creatinine ratio, urine   ANA   Iron, TIBC and Ferritin Panel   VITAMIN D 25 Hydroxy (Vit-D Deficiency, Fractures)   Meds ordered this encounter  Medications   clobetasol (TEMOVATE) 0.05 % external solution    Sig: Apply as needed for scalp rash on dry skin    Dispense:  50 mL    Refill:  1     Follow-Up Instructions: Return in about 3 months (around 09/14/2023) for New pt DLE/?SLE  on HCQ f/u 3mos.   Fuller Plan, MD  Note - This record has been  created using [employer].  Chart creation errors have been sought, but may not always  have been located. Such creation errors do not reflect on  the standard of medical care.

## 2023-06-15 LAB — IRON,TIBC AND FERRITIN PANEL
%SAT: 7 % — ABNORMAL LOW (ref 16–45)
Ferritin: 15 ng/mL — ABNORMAL LOW (ref 16–154)
Iron: 28 ug/dL — ABNORMAL LOW (ref 40–190)
TIBC: 383 ug/dL (ref 250–450)

## 2023-06-15 LAB — VITAMIN D 25 HYDROXY (VIT D DEFICIENCY, FRACTURES): Vit D, 25-Hydroxy: 23 ng/mL — ABNORMAL LOW (ref 30–100)

## 2023-06-15 LAB — PROTEIN / CREATININE RATIO, URINE
Creatinine, Urine: 200 mg/dL (ref 20–275)
Protein/Creat Ratio: 90 mg/g{creat} (ref 24–184)
Protein/Creatinine Ratio: 0.09 mg/mg{creat} (ref 0.024–0.184)
Total Protein, Urine: 18 mg/dL (ref 5–24)

## 2023-06-15 LAB — C3 AND C4
C3 Complement: 151 mg/dL (ref 83–193)
C4 Complement: 50 mg/dL (ref 15–57)

## 2023-06-15 LAB — SEDIMENTATION RATE: Sed Rate: 9 mm/h (ref 0–20)

## 2023-06-15 LAB — C-REACTIVE PROTEIN: CRP: <3 mg/L (ref <8.0)

## 2023-06-15 LAB — ANTI-DNA ANTIBODY, DOUBLE-STRANDED: ds DNA Ab: 1 [IU]/mL

## 2023-06-15 LAB — ANA: Anti Nuclear Antibody (ANA): NEGATIVE

## 2023-09-15 NOTE — Progress Notes (Deleted)
 Office Visit Note  Patient: Alexis Taylor             Date of Birth: 04-06-1996           MRN: [id number]             PCP: Pcp, No Referring: No ref. provider found Visit Date: 09/16/2023   Subjective:  No chief complaint on file.   History of Present Illness: ARMANDA FORAND is a 28 y.o. female here for follow up ***   Previous HPI 06/14/23 SHARNELL KNIGHT is a 28 y.o. 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 She was previously diagnosed since 2021 with onset of symptoms with lesions on the right ear with a biopsy performed in December 2020 findings consistent with cutaneous lupus erythematosus.  She had evaluation in rheumatology clinic in 2021 with lab testing indicating negative ANA no other concerning serology.  She was treated with topical tacrolimus and clobetasol as needed for skin lesions.  And last saw Dr. Erroll Luna 2022 without any maintenance therapy.  Continued having inflammation involving both ears with occasional outbreaks on the scalp.  Did not have any particular scarring lesions reported on the scalp.  During pregnancy with her now 69-year-old child she felt symptoms were doing pretty well off of any maintenance therapy.  Subsequently feels a bit worse overall with the skin rashes also describes joint pain and stiffness in multiple areas intermittently.. She saw Dr. Reche Dixon in April with chronic rash involvement on scalp and concha of ears and started on hydroxychloroquine 300 mg daily as well as topical corticosteroids. Joint problems have been worse for about 2 years.  She has a history of bilateral ankle injuries associated with playing tennis years ago.  Initially leg pain and swelling was attributed to pregnancy.  During the past few months she gets some pain and stiffness most commonly affecting bilateral knees.  Occasionally sees visible swelling often has some pain without visual changes.  Usually worst in the front of the knee.  She does not take  over-the-counter medications often sometimes uses ibuprofen with a partial benefit. She gets mouth ulcers intermittently most often along the inside lower lip sometimes on the roof of the mouth and these are usually not painful.  She does get erythematous rash on the face this is usually worse after sun exposure.  Does not get visible rash on other exposed areas though. She does have dry eye symptoms often also had Lasix eye surgery which she thinks might be contributory. She has not noticed any particular hair loss, lymphadenopathy, or Raynaud's symptoms. She is interested in having additional children possibly in the next 2 or 3 years but not currently attempting to conceive. She is interested in transferring care to our practice as apparently her sister is a patient here with for what sounds like pulmonary hypertension secondary to scleroderma.   Labs reviewed 11/2020 dsDNA neg C3 138 C4 71 Vit D 12   08/2019 ANA neg   No Rheumatology ROS completed.   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   Acne 01/02/2012   Dysmenorrhea 12/05/2011   Left ankle  injury 12/05/2011   Iron deficiency anemia 08/01/2011   Vitamin D deficiency 08/01/2011   Anemia 07/27/2011   Fatigue 07/27/2011   Health check for child over 34 days old 07/27/2011    Past Medical History:  Diagnosis Date   Anemia    Anxiety    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   Objective: Vital Signs: There were no vitals taken for this visit.   Physical Exam   Musculoskeletal Exam: ***  CDAI Exam: CDAI Score: -- Patient Global: --; Provider Global: -- Swollen: --; Tender: -- Joint Exam 09/16/2023   No joint exam has been documented for this visit   There is currently no information documented on the homunculus. Go to the Rheumatology activity and complete the homunculus joint exam.  Investigation: No additional findings.  Imaging: No results found.  Recent Labs: Lab Results  Component Value Date   WBC 10.1 05/17/2022   HGB 9.4 (L) 05/17/2022   PLT 216 05/17/2022    Speciality Comments: No specialty comments available.  Procedures:  No procedures performed Allergies: Patient has no known allergies.   Assessment / Plan:     Visit Diagnoses: No diagnosis found.  ***  Orders: No orders of the defined types were placed in this encounter.  No orders of the defined types were placed in this encounter.    Follow-Up Instructions: No follow-ups on file.   Fuller Plan, MD  Note - This record has been created using [employer].  Chart creation errors have been sought, but may not always  have been located. Such creation errors do not reflect on  the standard of medical care.

## 2023-09-16 ENCOUNTER — Ambulatory Visit: Payer: Medicaid Other | Admitting: Internal Medicine

## 2024-04-28 ENCOUNTER — Ambulatory Visit: Payer: Self-pay | Admitting: Internal Medicine

## 2024-09-03 ENCOUNTER — Encounter: Payer: Self-pay | Admitting: Internal Medicine

## 2024-09-03 ENCOUNTER — Ambulatory Visit: Attending: Internal Medicine | Admitting: Internal Medicine

## 2024-09-03 VITALS — BP 105/81 | HR 111 | Temp 97.7°F | Resp 16 | Ht 62.5 in | Wt 147.8 lb

## 2024-09-03 DIAGNOSIS — L930 Discoid lupus erythematosus: Secondary | ICD-10-CM | POA: Diagnosis present

## 2024-09-03 DIAGNOSIS — E559 Vitamin D deficiency, unspecified: Secondary | ICD-10-CM | POA: Insufficient documentation

## 2024-09-03 DIAGNOSIS — M2550 Pain in unspecified joint: Secondary | ICD-10-CM | POA: Diagnosis present

## 2024-09-03 DIAGNOSIS — D509 Iron deficiency anemia, unspecified: Secondary | ICD-10-CM | POA: Diagnosis present

## 2024-09-03 MED ORDER — CLOBETASOL PROPIONATE 0.05 % EX SOLN: CUTANEOUS | 2 refills | Status: AC

## 2024-09-03 NOTE — Assessment & Plan Note (Addendum)
 Common in skin lupus due to sun avoidance. Correction may reduce disease activity. - Ordered blood test to recheck vitamin D  levels. Orders:   VITAMIN D  25 Hydroxy (Vit-D Deficiency, Fractures)

## 2024-09-03 NOTE — Progress Notes (Signed)
 "  Office Visit Note  Patient: Alexis Taylor             Date of Birth: December 17, 1995           MRN: [id number]             PCP: Pcp, No Referring: No ref. provider found Visit Date: 09/03/2024   Subjective:   Discussed the use of AI scribe software for clinical note transcription with the patient, who gave verbal consent to proceed.  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 She is accompanied by her two-year-old child.  She manages her discoid lupus with a clobetasol  solution applied to her scalp and ears during flare-ups. She started on this after seeing her dermatologist last year. The solution is preferred over ointment due to ease of application, especially since she cannot see the affected areas well. It is effective when she feels a flare-up coming on.  She has not been using oral hydroxychloroquine due to nausea when taking the medicine.  Flare-ups occur more frequently after sun exposure, even minimal exposure such as walking through a parking lot. She experiences pain and symptoms later in the day or evening following sun exposure. There are periods when she is symptom-free and does not require medication.  In the past, she had low vitamin D  and iron deficiency, identified over a year ago. She is not currently taking supplements for these deficiencies and acknowledges experiencing fatigue, which could be related to these deficiencies.  Her primary concern remains the management of her skin symptoms and the impact of sun exposure on her condition.       Previous HPI 06/12/24 Alexis Taylor is a 29 y.o. 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 She was previously diagnosed since 2021 with onset of symptoms with lesions on the right ear with a biopsy performed in December 2020 findings consistent with cutaneous lupus erythematosus.  She had evaluation in  rheumatology clinic in 2021 with lab testing indicating negative ANA no other concerning serology.  She was treated with topical tacrolimus and clobetasol  as needed for skin lesions.  And last saw Dr. Mercie 2022 without any maintenance therapy.  Continued having inflammation involving both ears with occasional outbreaks on the scalp.  Did not have any particular scarring lesions reported on the scalp.  During pregnancy with her now 58-year-old child she felt symptoms were doing pretty well off of any maintenance therapy.  Subsequently feels a bit worse overall with the skin rashes also describes joint pain and stiffness in multiple areas intermittently.. She saw Dr. Jorizzo in April with chronic rash involvement on scalp and concha of ears and started on hydroxychloroquine 300 mg daily as well as topical corticosteroids. Joint problems have been worse for about 2 years.  She has a history of bilateral ankle injuries associated with playing tennis years ago.  Initially leg pain and swelling was attributed to pregnancy.  During the past few months she gets some pain and stiffness most commonly affecting bilateral knees.  Occasionally sees visible swelling often has some pain without visual changes.  Usually worst in the front of the knee.  She does not take over-the-counter medications often sometimes uses ibuprofen  with a partial benefit. She gets mouth ulcers intermittently most often along the inside lower lip sometimes on the roof of the mouth and these are usually not painful.  She does get erythematous rash on the face this is usually worse after sun exposure.  Does not get visible rash on other exposed areas though. She does have dry eye symptoms often also had Lasix eye surgery which she thinks might be contributory. She has not noticed any particular hair loss, lymphadenopathy, or Raynaud's symptoms. She is interested in having additional children possibly in the next 2 or 3 years but not currently  attempting to conceive. She is interested in transferring care to our practice as apparently her sister is a patient here with for what sounds like pulmonary hypertension secondary to scleroderma.   Labs reviewed 11/2020 dsDNA neg C3 138 C4 71 Vit D 12   08/2019 ANA neg   Review of Systems  Constitutional:  Negative for fatigue.  HENT:  Negative for mouth sores and mouth dryness.   Eyes:  Negative for dryness.  Respiratory:  Negative for shortness of breath.   Cardiovascular:  Negative for chest pain and palpitations.  Gastrointestinal:  Positive for constipation. Negative for blood in stool and diarrhea.  Endocrine: Negative for increased urination.  Genitourinary:  Negative for involuntary urination.  Musculoskeletal:  Positive for joint pain and joint pain. Negative for gait problem, joint swelling, myalgias, muscle weakness, morning stiffness, muscle tenderness and myalgias.  Skin:  Positive for sensitivity to sunlight. Negative for color change, rash and hair loss.  Allergic/Immunologic: Positive for susceptible to infections.  Neurological:  Negative for dizziness and headaches.  Hematological:  Negative for swollen glands.  Psychiatric/Behavioral:  Negative for depressed mood and sleep disturbance. The patient is nervous/anxious.     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   Acne 01/02/2012   Dysmenorrhea 12/05/2011   Left ankle injury 12/05/2011   Iron deficiency anemia 08/01/2011   Vitamin D  deficiency 08/01/2011   Anemia 07/27/2011   Fatigue 07/27/2011   Health check for child over 58 days old 07/27/2011    Past  Medical History:  Diagnosis Date   Anemia    Anxiety    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   Objective: Vital Signs: BP 105/81   Pulse (!) 111   Temp 97.7 F (36.5 C)   Resp 16   Ht 5' 2.5 (1.588 m)   Wt 147 lb 12.8 oz (67 kg)   LMP 09/01/2024   BMI 26.60 kg/m    Physical Exam Eyes:     Conjunctiva/sclera: Conjunctivae normal.  Cardiovascular:     Rate and Rhythm:  Normal rate and regular rhythm.  Pulmonary:     Effort: Pulmonary effort is normal.     Breath sounds: Normal breath sounds.  Lymphadenopathy:     Cervical: No cervical adenopathy.  Skin:    General: Skin is warm and dry.     Findings: Rash present.     Comments: <1cm diameter hyperpigmented patches on crown and occiput of scalp, in concha of ears  Neurological:     Mental Status: She is alert.  Psychiatric:        Mood and Affect: Mood normal.      Musculoskeletal Exam:  Shoulders full ROM no tenderness or swelling Elbows full ROM no tenderness or swelling Wrists full ROM no tenderness or swelling Fingers full ROM no tenderness or swelling Knees full ROM no tenderness or swelling   Investigation: No additional findings.  Imaging: No results found.  Recent Labs: Lab Results  Component Value Date   WBC 10.1 05/17/2022   HGB 9.4 (L) 05/17/2022   PLT 216 05/17/2022    Speciality Comments: No specialty comments  available.  Procedures:  No procedures performed Allergies: Patient has no known allergies.   Assessment / Plan:     Visit Diagnoses:  Assessment & Plan Discoid lupus erythematosus Polyarthralgia Flare-ups triggered by sun exposure, managed with clobetasol . No systemic inflammation. Oral medication not tolerated. No joint synovitis, not severely limiting joint pain or stiffness at this time. - Continue clobetasol  solution for flare-ups. - Avoid sun exposure; use sunscreen, hats, and sleeves.  Orders:   clobetasol  (TEMOVATE ) 0.05 % external solution; Apply as needed for scalp rash on dry skin   Sedimentation rate   CBC with Differential/Platelet  Iron deficiency anemia, unspecified iron deficiency anemia type Previous low iron levels, no fatigue reported. - Ordered blood test to recheck iron levels.  Orders:   Iron, TIBC and Ferritin Panel   CBC with Differential/Platelet  Vitamin D  deficiency Common in skin lupus due to sun avoidance. Correction may reduce disease activity. - Ordered blood test to recheck vitamin D  levels. Orders:   VITAMIN D  25 Hydroxy (Vit-D Deficiency, Fractures)    Follow-Up Instructions: Return if symptoms worsen or fail to improve.   Lonni LELON Ester, MD  Note - This record has been created using [employer].  Chart creation errors have been sought, but may not always  have been located. Such creation errors do not reflect on  the standard of medical care. "

## 2024-09-03 NOTE — Assessment & Plan Note (Addendum)
 Flare-ups triggered by sun exposure, managed with clobetasol . No systemic inflammation. Oral medication not tolerated. No joint synovitis, not severely limiting joint pain or stiffness at this time. - Continue clobetasol  solution for flare-ups. - Avoid sun exposure; use sunscreen, hats, and sleeves.  Orders:   clobetasol  (TEMOVATE ) 0.05 % external solution; Apply as needed for scalp rash on dry skin   Sedimentation rate   CBC with Differential/Platelet

## 2024-09-04 ENCOUNTER — Ambulatory Visit: Payer: Self-pay | Admitting: Internal Medicine

## 2024-09-04 LAB — CBC WITH DIFFERENTIAL/PLATELET
Absolute Lymphocytes: 1622 {cells}/uL (ref 850–3900)
Absolute Monocytes: 334 {cells}/uL (ref 200–950)
Basophils Absolute: 32 {cells}/uL (ref 0–200)
Basophils Relative: 0.6 %
Eosinophils Absolute: 159 {cells}/uL (ref 15–500)
Eosinophils Relative: 3 %
HCT: 41.3 % (ref 35.9–46.0)
Hemoglobin: 13.3 g/dL (ref 11.7–15.5)
MCH: 27.8 pg (ref 27.0–33.0)
MCHC: 32.2 g/dL (ref 31.6–35.4)
MCV: 86.4 fL (ref 81.4–101.7)
MPV: 11.3 fL (ref 7.5–12.5)
Monocytes Relative: 6.3 %
Neutro Abs: 3154 {cells}/uL (ref 1500–7800)
Neutrophils Relative %: 59.5 %
Platelets: 259 Thousand/uL (ref 140–400)
RBC: 4.78 Million/uL (ref 3.80–5.10)
RDW: 13.2 % (ref 11.0–15.0)
Total Lymphocyte: 30.6 %
WBC: 5.3 Thousand/uL (ref 3.8–10.8)

## 2024-09-04 LAB — IRON,TIBC AND FERRITIN PANEL
%SAT: 21 % (ref 16–45)
Ferritin: 43 ng/mL (ref 16–154)
Iron: 73 ug/dL (ref 40–190)
TIBC: 352 ug/dL (ref 250–450)

## 2024-09-04 LAB — VITAMIN D 25 HYDROXY (VIT D DEFICIENCY, FRACTURES): Vit D, 25-Hydroxy: 28 ng/mL — ABNORMAL LOW (ref 30–100)

## 2024-09-04 LAB — SEDIMENTATION RATE: Sed Rate: 9 mm/h (ref 0–20)

## 2024-09-10 NOTE — Telephone Encounter (Signed)
 Patient called the office again asking about the vitamin D , please advise
